# Patient Record
Sex: Male | Born: 1980 | Race: Asian | Hispanic: No | Marital: Married | Smoking: Former smoker
Health system: Southern US, Community
[De-identification: ages and names within clinical notes are randomized; demographics above are authoritative.]

## PROBLEM LIST (undated history)

## (undated) DIAGNOSIS — R011 Cardiac murmur, unspecified: Secondary | ICD-10-CM

## (undated) DIAGNOSIS — I10 Essential (primary) hypertension: Secondary | ICD-10-CM

## (undated) DIAGNOSIS — E785 Hyperlipidemia, unspecified: Secondary | ICD-10-CM

## (undated) HISTORY — DX: Essential (primary) hypertension: I10

## (undated) HISTORY — PX: CHOLECYSTECTOMY: SHX55

## (undated) HISTORY — DX: Hyperlipidemia, unspecified: E78.5

---

## 2018-01-17 ENCOUNTER — Encounter (HOSPITAL_COMMUNITY): Payer: Self-pay | Admitting: Family Medicine

## 2018-01-17 ENCOUNTER — Ambulatory Visit (HOSPITAL_COMMUNITY)
Admission: EM | Admit: 2018-01-17 | Discharge: 2018-01-17 | Disposition: A | Discharge status: Home / Self Care | Payer: Self-pay | Attending: Family Medicine | Admitting: Family Medicine

## 2018-01-17 DIAGNOSIS — G8929 Other chronic pain: Secondary | ICD-10-CM

## 2018-01-17 DIAGNOSIS — R1011 Right upper quadrant pain: Secondary | ICD-10-CM

## 2018-01-17 MED ORDER — TRAMADOL HCL 50 MG PO TABS: 50.0000 mg | ORAL_TABLET | Freq: Four times a day (QID) | ORAL | 0 refills | Status: DC | PRN

## 2018-01-17 MED ORDER — DICYCLOMINE HCL 20 MG PO TABS: 20.0000 mg | ORAL_TABLET | Freq: Two times a day (BID) | ORAL | 0 refills | Status: DC

## 2018-01-17 NOTE — Discharge Instructions (Addendum)
I believe you probably have gallstones.  To be sure, you will need an ultrasound.  I am suggesting that you try to put up with the pain tonight and go to the emergency room tomorrow morning when there is less traffic through the emergency room and the ultrasound will be available.  Do not eat breakfast tomorrow before going to the emergency department.  I have prescribed 2 different medicines which should help you endure the pain for tonight.

## 2018-01-17 NOTE — ED Triage Notes (Signed)
Pt presents with abdominal pain on right lower quadrant not associated with anything in particular.

## 2018-01-17 NOTE — ED Provider Notes (Signed)
[facility]    CSN: [id number] Arrival date & time: 01/17/18  1652     History   Chief Complaint Chief Complaint  Patient presents with  . Abdominal Pain    HPI Dustin [name] is a 37 y.o. male.   This is the first [facility] urgent care visit for this 37 year old man who complains of right sided abdominal pain and insomnia.  He has had chronic right sided abdominal pain for at least 6 months.  It is gotten worse the last few days.  He has had no nausea or vomiting, nor has he had fever or diarrhea.  He notes that his sister and father had gall bladder problems.  Patient's been up since 2 this morning with pain.  He notes that the pain does not change with eating.     History reviewed. No pertinent past medical history.  There are no active problems to display for this patient.   History reviewed. No pertinent surgical history.     Home Medications    Prior to Admission medications   Medication Sig Start Date End Date Taking? Authorizing Provider  dicyclomine (BENTYL) 20 MG tablet Take 1 tablet (20 mg total) by mouth 2 (two) times daily. 01/17/18   Elvina Sidle, MD  traMADol (ULTRAM) 50 MG tablet Take 1 tablet (50 mg total) by mouth every 6 (six) hours as needed. 01/17/18   Elvina Sidle, MD    Family History History reviewed. No pertinent family history.  Social History Social History   Tobacco Use  . Smoking status: Not on file  Substance Use Topics  . Alcohol use: Not on file  . Drug use: Not on file     Allergies   Patient has no known allergies.   Review of Systems Review of Systems  Gastrointestinal: Positive for abdominal pain.  All other systems reviewed and are negative.    Physical Exam Triage Vital Signs ED Triage Vitals  Enc Vitals Group     BP      Pulse      Resp      Temp      Temp src      SpO2      Weight      Height      Head Circumference      Peak Flow      Pain Score      Pain Loc      Pain  Edu?      Excl. in GC?    No data found.  Updated Vital Signs BP 125/76 (BP Location: Left Arm)   Pulse 85   Temp 98.2 F (36.8 C) (Oral)   Resp 18   SpO2 98%    Physical Exam  Constitutional: He appears well-developed and well-nourished.  HENT:  Head: Normocephalic and atraumatic.  Mouth/Throat: Oropharynx is clear and moist.  Eyes: Pupils are equal, round, and reactive to light.  Cardiovascular: Normal rate and normal heart sounds.  Pulmonary/Chest: Effort normal and breath sounds normal.  Abdominal: Soft. Normal appearance and bowel sounds are normal. There is tenderness in the right upper quadrant.  Neurological: He is alert.  Skin: Skin is warm.  Psychiatric: He has a normal mood and affect.  Nursing note and vitals reviewed.    UC Treatments / Results  Labs (all labs ordered are listed, but only abnormal results are displayed) Labs Reviewed - No data to display  EKG None  Radiology No results found.  Procedures Procedures (including  critical care time)  Medications Ordered in UC Medications - No data to display  Initial Impression / Assessment and Plan / UC Course  I have reviewed the triage vital signs and the nursing notes.  Pertinent labs & imaging results that were available during my care of the patient were reviewed by me and considered in my medical decision making (see chart for details).    Final Clinical Impressions(s) / UC Diagnoses   Final diagnoses:  Abdominal pain, chronic, right upper quadrant     Discharge Instructions     I believe you probably have gallstones.  To be sure, you will need an ultrasound.  I am suggesting that you try to put up with the pain tonight and go to the emergency room tomorrow morning when there is less traffic through the emergency room and the ultrasound will be available.  Do not eat breakfast tomorrow before going to the emergency department.  I have prescribed 2 different medicines which should help  you endure the pain for tonight.    ED Prescriptions    Medication Sig Dispense Auth. Provider   dicyclomine (BENTYL) 20 MG tablet Take 1 tablet (20 mg total) by mouth 2 (two) times daily. 20 tablet Elvina Sidle, MD   traMADol (ULTRAM) 50 MG tablet Take 1 tablet (50 mg total) by mouth every 6 (six) hours as needed. 15 tablet Elvina Sidle, MD     Controlled Substance Prescriptions [place] Controlled Substance Registry consulted? Not Applicable   Elvina Sidle, MD 01/17/18 1740

## 2018-01-25 ENCOUNTER — Emergency Department (HOSPITAL_COMMUNITY)
Admission: EM | Admit: 2018-01-25 | Discharge: 2018-01-25 | Disposition: A | Discharge status: Home / Self Care | Payer: 59 | Attending: Emergency Medicine | Admitting: Emergency Medicine

## 2018-01-25 ENCOUNTER — Emergency Department (HOSPITAL_COMMUNITY): Payer: 59

## 2018-01-25 ENCOUNTER — Encounter (HOSPITAL_COMMUNITY): Payer: Self-pay | Admitting: Emergency Medicine

## 2018-01-25 DIAGNOSIS — R1011 Right upper quadrant pain: Secondary | ICD-10-CM | POA: Diagnosis not present

## 2018-01-25 DIAGNOSIS — Z79899 Other long term (current) drug therapy: Secondary | ICD-10-CM | POA: Diagnosis not present

## 2018-01-25 LAB — CBC
HCT: 47.3 % (ref 39.0–52.0)
Hemoglobin: 15.2 g/dL (ref 13.0–17.0)
MCH: 26.5 pg (ref 26.0–34.0)
MCHC: 32.1 g/dL (ref 30.0–36.0)
MCV: 82.4 fL (ref 80.0–100.0)
Platelets: 238 10*3/uL (ref 150–400)
RBC: 5.74 MIL/uL (ref 4.22–5.81)
RDW: 12.9 % (ref 11.5–15.5)
WBC: 8.4 10*3/uL (ref 4.0–10.5)
nRBC: 0 % (ref 0.0–0.2)

## 2018-01-25 LAB — URINALYSIS, ROUTINE W REFLEX MICROSCOPIC
Bilirubin Urine: NEGATIVE
GLUCOSE, UA: NEGATIVE mg/dL
Ketones, ur: NEGATIVE mg/dL
Leukocytes, UA: NEGATIVE
NITRITE: NEGATIVE
PROTEIN: NEGATIVE mg/dL
SPECIFIC GRAVITY, URINE: 1.028 (ref 1.005–1.030)
pH: 5 (ref 5.0–8.0)

## 2018-01-25 LAB — COMPREHENSIVE METABOLIC PANEL
ALBUMIN: 4.3 g/dL (ref 3.5–5.0)
ALT: 19 U/L (ref 0–44)
AST: 20 U/L (ref 15–41)
Alkaline Phosphatase: 48 U/L (ref 38–126)
Anion gap: 7 (ref 5–15)
BUN: 15 mg/dL (ref 6–20)
CO2: 24 mmol/L (ref 22–32)
Calcium: 9.4 mg/dL (ref 8.9–10.3)
Chloride: 108 mmol/L (ref 98–111)
Creatinine, Ser: 0.92 mg/dL (ref 0.61–1.24)
GFR calc Af Amer: >60 mL/min (ref >60)
GLUCOSE: 94 mg/dL (ref 70–99)
Potassium: 3.8 mmol/L (ref 3.5–5.1)
Sodium: 139 mmol/L (ref 135–145)
TOTAL PROTEIN: 7.4 g/dL (ref 6.5–8.1)
Total Bilirubin: 0.8 mg/dL (ref 0.3–1.2)

## 2018-01-25 LAB — LIPASE, BLOOD: Lipase: 30 U/L (ref 11–51)

## 2018-01-25 MED ORDER — SODIUM CHLORIDE 0.9 % IV BOLUS
1000.0000 mL | Freq: Once | INTRAVENOUS | Status: AC
  Administered 2018-01-25: 1000 mL via INTRAVENOUS

## 2018-01-25 NOTE — ED Provider Notes (Signed)
Transfer of Care Note I assumed care of patient from Shawn Joy at 1600.  Agree with history, physical exam and plan.  See their note for further details.    Briefly, the patient is a 37 yo male with no PMHx who presents with abdominal pain for the past month.  Patient reports that the pain is mostly located in his right upper quadrant.  States it is been coming and going since the onset of his symptoms.  Current plan is as follows: Overall patient's evaluation has been unremarkable at the time of assumption of care.  At this time is to obtain right upper quadrant ultrasound and a UA to determine patient's disposition.  Reassessment: I personally reassessed the patient on the results of his negative right upper quadrant ultrasound and his UA.  UA does show small amount of blood and some bacteria.  After discussing this with the patient he also reported some intermittent right flank pain in addition to his right upper quadrant pain.  As a result of patient's UA findings and his report of right flank pain a CT stone study was obtained.  This was overall unremarkable.  As result I feel patient is appropriate for discharge at this time.  Given the bacteria seen on patient's UA a urine culture was sent.  We will plan to treat patient with antibiotics if his urine culture is positive. Patient is aware of this plan.  Asked to establish care with a primary care physician for follow-up in the next week.  Patient given very strict return cautions.  Patient in no acute distress at the time of discharge.   The care of this patient was supervised by Dr. Jacqulyn Bath, who agreed with the plan and management of the patient.   Clinical Impression: 1. RUQ pain     ED Dispo: Discharge    Ayman Brull, Winfield Rast, MD 01/25/18 Melchor Amour, MD 01/26/18 [phone]

## 2018-01-25 NOTE — ED Notes (Signed)
Pt escorted to bathroom to provide urine sample.

## 2018-01-25 NOTE — ED Provider Notes (Signed)
Dustin [name] [facility] EMERGENCY DEPARTMENT Provider Note   CSN: [id number] Arrival date & time: 01/25/18  1335     History   Chief Complaint Chief Complaint  Patient presents with  . Abdominal Pain    HPI Dustin [name] is a 37 y.o. male.  HPI  Dustin [name] is a 37 y.o. male, patient with no pertinent past medical history, presenting to the ED with abdominal pain for the past month.  Pain is in the right upper quadrant, sharp, fairly constant, 10/10, nonradiating.  He states it does not seem to change throughout the day or with eating.  Accompanied by occasional nausea and occasional diarrhea. Denies fever/chills, vomiting, constipation, dysuria, hematuria, hematochezia/melena, or any complaints.   History reviewed. No pertinent past medical history.  There are no active problems to display for this patient.   History reviewed. No pertinent surgical history.      Home Medications    Prior to Admission medications   Medication Sig Start Date End Date Taking? Authorizing Provider  dicyclomine (BENTYL) 20 MG tablet Take 1 tablet (20 mg total) by mouth 2 (two) times daily. 01/17/18   Elvina Sidle, MD  traMADol (ULTRAM) 50 MG tablet Take 1 tablet (50 mg total) by mouth every 6 (six) hours as needed. Patient taking differently: Take 50 mg by mouth every 6 (six) hours as needed for moderate pain.  01/17/18   Elvina Sidle, MD    Family History No family history on file.  Social History Social History   Tobacco Use  . Smoking status: Not on file  Substance Use Topics  . Alcohol use: Not on file  . Drug use: Not on file     Allergies   Patient has no known allergies.   Review of Systems Review of Systems  Constitutional: Negative for chills, diaphoresis and fever.  Respiratory: Negative for shortness of breath.   Cardiovascular: Negative for chest pain.  Gastrointestinal: Positive for abdominal pain, diarrhea and nausea. Negative for blood in  stool and vomiting.  Genitourinary: Negative for dysuria, hematuria, scrotal swelling and testicular pain.  All other systems reviewed and are negative.    Physical Exam Updated Vital Signs BP 107/82   Pulse (!) 111   Temp 98.2 F (36.8 C) (Oral)   Resp 15   SpO2 99%   Physical Exam  Constitutional: He appears well-developed and well-nourished. No distress.  HENT:  Head: Normocephalic and atraumatic.  Eyes: Conjunctivae are normal.  Neck: Neck supple.  Cardiovascular: Normal rate, regular rhythm, normal heart sounds and intact distal pulses.  Pulmonary/Chest: Effort normal and breath sounds normal. No respiratory distress.  Abdominal: Soft. There is tenderness (indicated verbally only) in the right upper quadrant. There is no guarding.  Musculoskeletal: He exhibits no edema.  Lymphadenopathy:    He has no cervical adenopathy.  Neurological: He is alert.  Skin: Skin is warm and dry. He is not diaphoretic.  Psychiatric: He has a normal mood and affect. His behavior is normal.  Nursing note and vitals reviewed.    ED Treatments / Results  Labs (all labs ordered are listed, but only abnormal results are displayed) Labs Reviewed  LIPASE, BLOOD  COMPREHENSIVE METABOLIC PANEL  CBC  URINALYSIS, ROUTINE W REFLEX MICROSCOPIC    EKG None  Radiology No results found.  Procedures Procedures (including critical care time)  Medications Ordered in ED Medications  sodium chloride 0.9 % bolus 1,000 mL (1,000 mLs Intravenous New Bag/Given 01/25/18 1536)     Initial Impression /  Assessment and Plan / ED Course  I have reviewed the triage vital signs and the nursing notes.  Pertinent labs & imaging results that were available during my care of the patient were reviewed by me and considered in my medical decision making (see chart for details).  Clinical Course as of Jan 26 1539  Wynelle Link Jan 25, 2018  1432 Patient declines analgesics at this time.   [SJ]    Clinical Course  User Index [SJ] Joy, Shawn C, PA-C    Patient presents with right upper quadrant abdominal pain for the past month.  He was seen by urgent care at the end of October and it was recommended that he go to the ED and have an ultrasound performed at that time, but he did not do so. CBC, CMP, and lipase reassuring.  End of shift patient care handoff report given to Dr. Otho Najjar, EM resident.  Plan: Right upper quadrant ultrasound pending.  Review ultrasound and UA results.  Reexamined patient.   Final Clinical Impressions(s) / ED Diagnoses   Final diagnoses:  RUQ pain    ED Discharge Orders    None       Concepcion Living 01/25/18 1543    Gwyneth Sprout, MD 01/25/18 1547

## 2018-01-25 NOTE — ED Triage Notes (Signed)
Patient reports RLQ pain, lower back pain and pain in his penis when he urinates, symptoms have been progressing over the past few months. Was seen at [facility] on 10/26 and was told to go to his doctor and have an ultrasound but his family member at bedside states he never went.

## 2018-01-27 LAB — URINE CULTURE

## 2018-02-17 ENCOUNTER — Other Ambulatory Visit: Payer: Self-pay | Admitting: Gastroenterology

## 2018-02-17 ENCOUNTER — Other Ambulatory Visit (HOSPITAL_COMMUNITY): Payer: Self-pay | Admitting: Gastroenterology

## 2018-02-17 DIAGNOSIS — R1011 Right upper quadrant pain: Secondary | ICD-10-CM

## 2018-02-27 ENCOUNTER — Encounter (HOSPITAL_COMMUNITY)
Admission: RE | Admit: 2018-02-27 | Discharge: 2018-02-27 | Disposition: A | Discharge status: Home / Self Care | Payer: 59 | Source: Ambulatory Visit | Attending: Gastroenterology | Admitting: Gastroenterology

## 2018-02-27 DIAGNOSIS — R1011 Right upper quadrant pain: Secondary | ICD-10-CM | POA: Diagnosis not present

## 2018-02-27 MED ORDER — TECHNETIUM TC 99M MEBROFENIN IV KIT
5.2000 | PACK | Freq: Once | INTRAVENOUS | Status: AC | PRN
  Administered 2018-02-27: 5.2 via INTRAVENOUS

## 2018-03-06 ENCOUNTER — Ambulatory Visit: Payer: Self-pay | Admitting: Surgery

## 2018-03-06 NOTE — H&P (Signed)
History of Present Illness Dustin [name](Dustin [name] K. Dustin Muecke MD; 03/06/2018 10:46 AM) The patient is a 37 year old male who presents for evaluation of gall stones. Referred by Dr. Levora AngelBrahmbhatt for biliary dyskinesia  This is a 37 year old [place] male who presents with several months of right upper quadrant abdominal pain. The pain is intermittent. It is fairly severe. The pain radiates around to his right flank. It is associated with bloating, increased flatulence, and diarrhea. He denies any nausea or vomiting. He has had a thorough evaluation including CT renal stone study, ultrasound, and HIDA scan. The only positive finding was a decreased gallbladder ejection fraction of 29%. When the patient has the pain, it is fairly severe. His diet consists of a lot of red meat, especially Lamb. He is here now to discuss cholecystectomy.   CLINICAL DATA: Right upper quadrant pain for 1 month.  EXAM: ULTRASOUND ABDOMEN LIMITED RIGHT UPPER QUADRANT  COMPARISON: None.  FINDINGS: Gallbladder:  The gallbladder is contracted limiting evaluation. However, no stones, sludge, wall thickening, pericholecystic fluid, or Murphy's sign appreciated.  Common bile duct:  Diameter: 2 mm  Liver:  The left hepatic lobe is not well seen due to shadowing bowel gas. Increased diffuse hepatic echogenicity. No focal mass. Portal vein is patent on color Doppler imaging with normal direction of blood flow towards the liver.  IMPRESSION: 1. The gallbladder is contracted limiting evaluation. However, no gallbladder abnormalities are noted. 2. The left hepatic lobe is not well seen due to shadowing bowel gas. Probable hepatic steatosis.   Electronically Signed By: Dustin [name] M.D On: 01/25/2018 16:40  Study Result  CLINICAL DATA: Right upper quadrant pain EXAM: NUCLEAR MEDICINE HEPATOBILIARY IMAGING WITH GALLBLADDER EF TECHNIQUE: Sequential images of the abdomen were obtained out to 60 minutes  following intravenous administration of radiopharmaceutical. After oral ingestion of Ensure, gallbladder ejection fraction was determined. At 60 min, normal ejection fraction is greater than 33%. RADIOPHARMACEUTICALS: 5.2 mCi Tc-6556m Choletec IV COMPARISON: None. FINDINGS: Prompt uptake and biliary excretion of activity by the liver is seen. Gallbladder activity is visualized, consistent with patency of cystic duct. Biliary activity passes into small bowel, consistent with patent common bile duct. Calculated gallbladder ejection fraction is 29%. (Normal gallbladder ejection fraction with Ensure is greater than 33%.) IMPRESSION: Normal uptake and excretion of biliary tracer. Decreased gallbladder ejection fraction at 29%. Recent CT demonstrated completely decompressed gallbladder. No duplication of the patient's symptoms were noted during fatty meal administration. Electronically Signed By: Alcide CleverMark [name] M.D. On: 02/27/2018 13:40     Past Surgical History (Armen Emelda FearFerguson, CMA; 03/06/2018 10:15 AM) No pertinent past surgical history   Diagnostic Studies History Renee Ramus(Armen Ferguson, CMA; 03/06/2018 10:15 AM) Colonoscopy  never  Allergies Renee Ramus(Armen Ferguson, CMA; 03/06/2018 10:16 AM) No Known Allergies [03/06/2018]:  Medication History (Armen Ferguson, CMA; 03/06/2018 10:16 AM) No Current Medications Medications Reconciled  Social History Renee Ramus(Armen Ferguson, CMA; 03/06/2018 10:15 AM) Alcohol use  Remotely quit alcohol use. No caffeine use  No drug use  Tobacco use  Former smoker.  Family History Renee Ramus(Armen Ferguson, CMA; 03/06/2018 10:15 AM) Diabetes Mellitus  Brother, Father, Sister. Heart Disease  Father. Heart disease in male family member before age 38  Hypertension  Mother.  Other Problems Renee Ramus(Armen Ferguson, CMA; 03/06/2018 10:15 AM) Back Pain  Hypercholesterolemia     Review of Systems (Armen Ferguson CMA; 03/06/2018 10:15 AM) General Present- Fatigue. Not  Present- Appetite Loss, Chills, Fever, Night Sweats, Weight Gain and Weight Loss. Skin Not Present- Change in Wart/Mole, Dryness, Hives, Jaundice,  New Lesions, Non-Healing Wounds, Rash and Ulcer. HEENT Not Present- Earache, Hearing Loss, Hoarseness, Nose Bleed, Oral Ulcers, Ringing in the Ears, Seasonal Allergies, Sinus Pain, Sore Throat, Visual Disturbances, Wears glasses/contact lenses and Yellow Eyes. Respiratory Not Present- Bloody sputum, Chronic Cough, Difficulty Breathing, Snoring and Wheezing. Breast Not Present- Breast Mass, Breast Pain, Nipple Discharge and Skin Changes. Cardiovascular Not Present- Chest Pain, Difficulty Breathing Lying Down, Leg Cramps, Palpitations, Rapid Heart Rate, Shortness of Breath and Swelling of Extremities. Gastrointestinal Present- Abdominal Pain and Excessive gas. Not Present- Bloating, Bloody Stool, Change in Bowel Habits, Chronic diarrhea, Constipation, Difficulty Swallowing, Gets full quickly at meals, Hemorrhoids, Indigestion, Nausea, Rectal Pain and Vomiting. Male Genitourinary Present- Blood in Urine. Not Present- Change in Urinary Stream, Frequency, Impotence, Nocturia, Painful Urination, Urgency and Urine Leakage. Musculoskeletal Not Present- Back Pain, Joint Pain, Joint Stiffness, Muscle Pain, Muscle Weakness and Swelling of Extremities. Neurological Not Present- Decreased Memory, Fainting, Headaches, Numbness, Seizures, Tingling, Tremor, Trouble walking and Weakness. Psychiatric Not Present- Anxiety, Bipolar, Change in Sleep Pattern, Depression, Fearful and Frequent crying. Endocrine Not Present- Cold Intolerance, Excessive Hunger, Hair Changes, Heat Intolerance, Hot flashes and New Diabetes.  Vitals (Armen Ferguson CMA; 03/06/2018 10:15 AM) 03/06/2018 10:15 AM Weight: 252 lb Height: 73in Body Surface Area: 2.37 m Body Mass Index: 33.25 kg/m  Temp.: 98.54F  Pulse: 82 (Regular)  P.OX: 97% (Room air) BP: 120/90 (Sitting, Left Arm,  Standard)       Physical Exam Molli Hazard K. Safir Michalec MD; 03/06/2018 10:46 AM) The physical exam findings are as follows: Note:WDWN in NAD Eyes: Pupils equal, round; sclera anicteric HENT: Oral mucosa moist; good dentition Neck: No masses palpated, no thyromegaly Lungs: CTA bilaterally; normal respiratory effort CV: Regular rate and rhythm; no murmurs; extremities well-perfused with no edema Abd: +bowel sounds, soft, mild RUQ tenderness, no palpable organomegaly; no palpable hernias Skin: Warm, dry; no sign of jaundice Psychiatric - alert and oriented x 4; calm mood and affect    Assessment & Plan Molli Hazard K. Ayano Douthitt MD; 03/06/2018 10:40 AM) BILIARY DYSKINESIA (K82.8) Current Plans Schedule for Surgery - Laparoscopic cholecystectomy with intraoperative cholangiogram. The surgical procedure has been discussed with the patient. Potential risks, benefits, alternative treatments, and expected outcomes have been explained. All of the patient's questions at this time have been answered. The likelihood of reaching the patient's treatment goal is good. The patient understand the proposed surgical procedure and wishes to proceed.  Dustin [name]. Corliss Skains, MD, [facility]  General/ Trauma Surgery Beeper [phone]  03/06/2018 10:48 AM

## 2020-01-13 ENCOUNTER — Other Ambulatory Visit: Payer: Self-pay

## 2020-01-13 ENCOUNTER — Emergency Department (HOSPITAL_BASED_OUTPATIENT_CLINIC_OR_DEPARTMENT_OTHER)
Admit: 2020-01-13 | Discharge: 2020-01-13 | Disposition: A | Discharge status: Home / Self Care | Payer: No Typology Code available for payment source | Attending: Emergency Medicine | Admitting: Emergency Medicine

## 2020-01-13 ENCOUNTER — Emergency Department (HOSPITAL_COMMUNITY): Payer: No Typology Code available for payment source

## 2020-01-13 ENCOUNTER — Emergency Department (HOSPITAL_COMMUNITY)
Admission: EM | Admit: 2020-01-13 | Discharge: 2020-01-13 | Disposition: A | Discharge status: Home / Self Care | Payer: No Typology Code available for payment source | Attending: Emergency Medicine | Admitting: Emergency Medicine

## 2020-01-13 DIAGNOSIS — M7989 Other specified soft tissue disorders: Secondary | ICD-10-CM | POA: Insufficient documentation

## 2020-01-13 DIAGNOSIS — R5383 Other fatigue: Secondary | ICD-10-CM | POA: Diagnosis not present

## 2020-01-13 DIAGNOSIS — Z20822 Contact with and (suspected) exposure to covid-19: Secondary | ICD-10-CM | POA: Insufficient documentation

## 2020-01-13 DIAGNOSIS — R52 Pain, unspecified: Secondary | ICD-10-CM

## 2020-01-13 DIAGNOSIS — R079 Chest pain, unspecified: Secondary | ICD-10-CM

## 2020-01-13 DIAGNOSIS — R0789 Other chest pain: Secondary | ICD-10-CM | POA: Diagnosis present

## 2020-01-13 LAB — CBC
HCT: 47.2 % (ref 39.0–52.0)
Hemoglobin: 14.9 g/dL (ref 13.0–17.0)
MCH: 25.9 pg — ABNORMAL LOW (ref 26.0–34.0)
MCHC: 31.6 g/dL (ref 30.0–36.0)
MCV: 81.9 fL (ref 80.0–100.0)
Platelets: 214 10*3/uL (ref 150–400)
RBC: 5.76 MIL/uL (ref 4.22–5.81)
RDW: 13.2 % (ref 11.5–15.5)
WBC: 6.2 10*3/uL (ref 4.0–10.5)
nRBC: 0 % (ref 0.0–0.2)

## 2020-01-13 LAB — BASIC METABOLIC PANEL
Anion gap: 13 (ref 5–15)
BUN: 15 mg/dL (ref 6–20)
CO2: 20 mmol/L — ABNORMAL LOW (ref 22–32)
Calcium: 9.6 mg/dL (ref 8.9–10.3)
Chloride: 105 mmol/L (ref 98–111)
Creatinine, Ser: 0.75 mg/dL (ref 0.61–1.24)
GFR, Estimated: >60 mL/min (ref >60)
Glucose, Bld: 104 mg/dL — ABNORMAL HIGH (ref 70–99)
Potassium: 4.3 mmol/L (ref 3.5–5.1)
Sodium: 138 mmol/L (ref 135–145)

## 2020-01-13 LAB — RESPIRATORY PANEL BY RT PCR (FLU A&B, COVID)
Influenza A by PCR: NEGATIVE
Influenza B by PCR: NEGATIVE
SARS Coronavirus 2 by RT PCR: NEGATIVE

## 2020-01-13 LAB — TROPONIN I (HIGH SENSITIVITY): Troponin I (High Sensitivity): 4 ng/L (ref <18)

## 2020-01-13 MED ORDER — SODIUM CHLORIDE 0.9 % IV BOLUS
1000.0000 mL | Freq: Once | INTRAVENOUS | Status: AC
  Administered 2020-01-13: 1000 mL via INTRAVENOUS

## 2020-01-13 MED ORDER — METOCLOPRAMIDE HCL 5 MG/ML IJ SOLN
10.0000 mg | Freq: Once | INTRAMUSCULAR | Status: AC
  Administered 2020-01-13: 10 mg via INTRAVENOUS
  Filled 2020-01-13: qty 2

## 2020-01-13 MED ORDER — ONDANSETRON HCL 4 MG/2ML IJ SOLN: 4.0000 mg | Freq: Once | INTRAMUSCULAR | Status: DC

## 2020-01-13 MED ORDER — METHOCARBAMOL 500 MG PO TABS: 500.0000 mg | ORAL_TABLET | Freq: Two times a day (BID) | ORAL | 0 refills | Status: DC

## 2020-01-13 MED ORDER — DIPHENHYDRAMINE HCL 25 MG PO CAPS
25.0000 mg | ORAL_CAPSULE | Freq: Once | ORAL | Status: AC
  Administered 2020-01-13: 25 mg via ORAL
  Filled 2020-01-13: qty 1

## 2020-01-13 MED ORDER — MORPHINE SULFATE (PF) 4 MG/ML IV SOLN
4.0000 mg | Freq: Once | INTRAVENOUS | Status: AC
  Administered 2020-01-13: 4 mg via INTRAVENOUS
  Filled 2020-01-13: qty 1

## 2020-01-13 NOTE — ED Provider Notes (Signed)
[facility] EMERGENCY DEPARTMENT Provider Note   CSN: [id number] Arrival date & time: 01/13/20  1021     History Chief Complaint  Patient presents with   Chest Pain    Dustin [name] is a 39 y.o. male.  HPI Patient is a 39 year old male with a past medical history significant for questionable hypercholesterolemia per his brother who is translating for him.  Patient is Arabic speaking.  He states that he is comfortable with translation by his brother who has more medical literacy than him.  Patient has numerous complaints today.  He states he is not a primary care doctor and is not sure who to follow-up with these issues for besides the ER.  What brought him to the ER today however was chest pain that has been intermittent over the past 3 days but became constant yesterday evening with new onset shortness of breath and some nausea without vomiting.  He denies any abdominal pain.  He states that the chest pain last night started radiating to his left arm and he started having severe pain in his left leg.  He states he has had some left calf pain for the past 3 weeks.  He denies any aggravating or mitigating factors resistance.  He denies any pleuritic chest pain or exertional chest pain.  The symptoms seem to come on while he was at rest.  Is been constant although waxing and waning since onset.  Denies any diaphoresis, vomiting, changes in bowel movements, lightheadedness or dizziness.  Denies any history of VTE.  He denies any alcohol, recreational drugs or smoking.  Does have family medical history of myocardial infarction including a father who at 80 year old had an MI.  Patient has also had issues sleeping for the past several months.  And has been more fatigued after eating.  Denies any other postprandial symptoms.  Denies any urinary complaints.  Also endorses some difficulty sleeping.  He states he has had upper left extremity paresthesias that feels like  pins-and-needles that began yesterday evening as well.     No past medical history on file.  There are no problems to display for this patient.   No past surgical history on file.     No family history on file.  Social History   Tobacco Use   Smoking status: Not on file  Substance Use Topics   Alcohol use: Not on file   Drug use: Not on file    Home Medications Prior to Admission medications   Medication Sig Start Date End Date Taking? Authorizing Provider  dicyclomine (BENTYL) 20 MG tablet Take 1 tablet (20 mg total) by mouth 2 (two) times daily. 01/17/18   Elvina Sidle, MD  methocarbamol (ROBAXIN) 500 MG tablet Take 1 tablet (500 mg total) by mouth 2 (two) times daily. 01/13/20   Gailen Shelter, PA  traMADol (ULTRAM) 50 MG tablet Take 1 tablet (50 mg total) by mouth every 6 (six) hours as needed. Patient taking differently: Take 50 mg by mouth every 6 (six) hours as needed for moderate pain.  01/17/18   Elvina Sidle, MD    Allergies    Patient has no known allergies.  Review of Systems   Review of Systems  Constitutional: Positive for fatigue. Negative for chills and fever.  HENT: Negative for congestion.   Eyes: Negative for pain.  Respiratory: Negative for cough and shortness of breath.   Cardiovascular: Positive for chest pain and leg swelling.  Gastrointestinal: Positive for nausea. Negative for  abdominal pain, constipation, diarrhea and vomiting.  Genitourinary: Negative for dysuria.  Musculoskeletal: Positive for myalgias.  Skin: Negative for rash.  Neurological: Positive for headaches. Negative for dizziness.    Physical Exam Updated Vital Signs BP 128/85    Pulse 66    Temp 97.9 F (36.6 C) (Oral)    Resp (!) 21    Ht 6\' 1"  (1.854 m)    Wt 111.1 kg    SpO2 98%    BMI 32.32 kg/m   Physical Exam Vitals and nursing note reviewed.  Constitutional:      General: He is not in acute distress.    Comments: Pleasant well-appearing 38 year old.   In no acute distress.  Sitting comfortably in bed.  Able answer questions (through translator) appropriately and follow commands. No increased work of breathing. Speaking in full sentences.  HENT:     Head: Normocephalic and atraumatic.     Nose: Nose normal.     Mouth/Throat:     Mouth: Mucous membranes are moist.  Eyes:     General: No scleral icterus. Cardiovascular:     Rate and Rhythm: Normal rate and regular rhythm.     Pulses: Normal pulses.     Heart sounds: Normal heart sounds.  Pulmonary:     Effort: Pulmonary effort is normal. No respiratory distress.     Breath sounds: No wheezing.  Abdominal:     Palpations: Abdomen is soft.     Tenderness: There is no abdominal tenderness. There is no right CVA tenderness, left CVA tenderness, guarding or rebound.  Musculoskeletal:     Cervical back: Normal range of motion.     Right lower leg: No edema.     Left lower leg: No edema.     Comments: No lower extremity edema.  Very mild left calf circumference discrepancy with some mild tenderness palpation of the left calf no bruising no step-off or deformity no bony tenderness.  Strength 5/5 bilateral upper and lower extremities.  Skin:    General: Skin is warm and dry.     Capillary Refill: Capillary refill takes less than 2 seconds.  Neurological:     Mental Status: He is alert. Mental status is at baseline.     Comments: Speech is fluent, clear without dysarthria or dysphasia.   Strength 5/5 in upper/lower extremities  Sensation intact in upper/lower extremities   CN I not tested  CN II grossly intact visual fields bilaterally. Did not visualize posterior eye.   CN III, IV, VI PERRLA and EOMs intact bilaterally  CN V Intact sensation to sharp and light touch to the face  CN VII facial movements symmetric  CN VIII not tested  CN IX, X no uvula deviation, symmetric rise of soft palate  CN XI 5/5 SCM and trapezius strength bilaterally  CN XII Midline tongue protrusion,  symmetric L/R movements   Psychiatric:        Mood and Affect: Mood normal.        Behavior: Behavior normal.     ED Results / Procedures / Treatments   Labs (all labs ordered are listed, but only abnormal results are displayed) Labs Reviewed  BASIC METABOLIC PANEL - Abnormal; Notable for the following components:      Result Value   CO2 20 (*)    Glucose, Bld 104 (*)    All other components within normal limits  CBC - Abnormal; Notable for the following components:   MCH 25.9 (*)    All other  components within normal limits  RESPIRATORY PANEL BY RT PCR (FLU A&B, COVID)  TROPONIN I (HIGH SENSITIVITY)    EKG EKG Interpretation  Date/Time:  Thursday January 13 2020 10:27:40 EDT Ventricular Rate:  74 PR Interval:  180 QRS Duration: 92 QT Interval:  368 QTC Calculation: 408 R Axis:   27 Text Interpretation: Normal sinus rhythm Normal ECG No old tracing to compare Confirmed by Jacalyn Lefevre [phone]) on 01/13/2020 12:58:36 PM   Radiology DG Chest 2 View  Result Date: 01/13/2020 CLINICAL DATA:  Chest pain, nausea EXAM: CHEST - 2 VIEW COMPARISON:  None. FINDINGS: The heart size and mediastinal contours are within normal limits. Both lungs are clear. The visualized skeletal structures are unremarkable. IMPRESSION: No active cardiopulmonary disease. Electronically Signed   By: Helyn Numbers MD   On: 01/13/2020 11:09   VAS [place] LOWER EXTREMITY VENOUS (DVT) (ONLY [facility] & [facility] 7a-7p)  Result Date: 01/13/2020  Lower Venous DVT Study Indications: Pain.  Risk Factors: None identified. Comparison Study: No prior studies. Performing Technologist: Chanda Busing RVT  Examination Guidelines: A complete evaluation includes B-mode imaging, spectral Doppler, color Doppler, and power Doppler as needed of all accessible portions of each vessel. Bilateral testing is considered an integral part of a complete examination. Limited examinations for reoccurring indications may be performed as noted. The  reflux portion of the exam is performed with the patient in reverse Trendelenburg.  +-----+---------------+---------+-----------+----------+--------------+  RIGHT Compressibility Phasicity Spontaneity Properties Thrombus Aging  +-----+---------------+---------+-----------+----------+--------------+  CFV   Full            Yes       Yes                                    +-----+---------------+---------+-----------+----------+--------------+   +---------+---------------+---------+-----------+----------+--------------+  LEFT      Compressibility Phasicity Spontaneity Properties Thrombus Aging  +---------+---------------+---------+-----------+----------+--------------+  CFV       Full            Yes       Yes                                    +---------+---------------+---------+-----------+----------+--------------+  SFJ       Full                                                             +---------+---------------+---------+-----------+----------+--------------+  FV Prox   Full                                                             +---------+---------------+---------+-----------+----------+--------------+  FV Mid    Full                                                             +---------+---------------+---------+-----------+----------+--------------+  FV Distal Full                                                             +---------+---------------+---------+-----------+----------+--------------+  PFV       Full                                                             +---------+---------------+---------+-----------+----------+--------------+  POP       Full            Yes       Yes                                    +---------+---------------+---------+-----------+----------+--------------+  PTV       Full                                                             +---------+---------------+---------+-----------+----------+--------------+  PERO      Full                                                              +---------+---------------+---------+-----------+----------+--------------+     Summary: RIGHT: - No evidence of common femoral vein obstruction.  LEFT: - There is no evidence of deep vein thrombosis in the lower extremity.  - No cystic structure found in the popliteal fossa.  *See table(s) above for measurements and observations.    Preliminary     Procedures Procedures (including critical care time)  Medications Ordered in ED Medications  morphine 4 MG/ML injection 4 mg (4 mg Intravenous Given 01/13/20 1232)  sodium chloride 0.9 % bolus 1,000 mL (1,000 mLs Intravenous New Bag/Given 01/13/20 1231)  metoCLOPramide (REGLAN) injection 10 mg (10 mg Intravenous Given 01/13/20 1232)  diphenhydrAMINE (BENADRYL) capsule 25 mg (25 mg Oral Given 01/13/20 1232)    ED Course  I have reviewed the triage vital signs and the nursing notes.  Pertinent labs & imaging results that were available during my care of the patient were reviewed by me and considered in my medical decision making (see chart for details).  Patient is 40 year old Arabic speaking gentleman translator used via his brother who is medical functional.  Patient has no known past medical history does not frequently see primary care doctor.  Presented today with multiple symptoms including paresthesias of left arm with no objective numbness or weakness of any extremities.  Left calf pain, chest pain and fatigue.  The symptoms of all been ongoing for some time although his chest pain became worse and constant at 3 PM yesterday has not abated since.  Low suspicion for ACS or PE.  Will obtain troponin  x1.  Chest x-ray, lower extremity ultrasound and basic labs with Covid test.  Respiratory panel negative for Covid and flu.  CBC without leukocytosis or anemia.  BMP with no significant electrolyte abnormalities.  Troponin x1 within normal limits.  EKG is normal sinus rhythm without axis deviation ST-T wave abnormalities or evidence  of ischemia.  Vascular ultrasound is negative for DVT.  Clinical Course as of Jan 12 1500  Thu Jan 13, 2020  1339 LLE negative for DVT   [WF]    Clinical Course User Index [WF] Gailen ShelterFondaw, Mansfield Dann S, [place]   MDM Rules/Calculators/A&P                          Patient and brother made aware of results.  They are understanding of plan to discharge at this time.  They are agreeable to plan.  Will provide patient with Robaxin and Tylenol and ibuprofen recommendations.  Will follow up with her primary care doctor.  He was given strict return precautions the ER.  On my reassessment patient was asleep in the bed.  He denies any symptoms currently.  The medical records were personally reviewed by myself. I personally reviewed all lab results and interpreted all imaging studies and either concurred with their official read or contacted radiology for clarification. Additional history obtained from old records family members.  This patient appears reasonably screened and I doubt any other medical condition requiring further workup, evaluation, or treatment in the ED at this time prior to discharge.   Patient's vitals are WNL apart from vital sign abnormalities discussed above, patient is in NAD, and able to ambulate in the ED at their baseline and able to tolerate PO.  Pain has been managed or a plan has been made for home management and has no complaints prior to discharge. Patient is comfortable with above plan and for discharge at this time. All questions were answered prior to disposition. Results from the ER workup discussed with the patient face to face and all questions answered to the best of my ability. The patient is safe for discharge with strict return precautions. Patient appears safe for discharge with appropriate follow-up. Conveyed my impression with the patient and they voiced understanding and are agreeable to plan.   An After Visit Summary was printed and given to the patient.  Portions of  this note were generated with Scientist, clinical (histocompatibility and immunogenetics)Dragon dictation software. Dictation errors may occur despite best attempts at proofreading.   I discussed this case with my attending physician who cosigned this note including patient's presenting symptoms, physical exam, and planned diagnostics and interventions. Attending physician stated agreement with plan or made changes to plan which were implemented.    Final Clinical Impression(s) / ED Diagnoses Final diagnoses:  Chest pain, unspecified type  Left leg swelling  Fatigue, unspecified type    Rx / DC Orders ED Discharge Orders         Ordered    methocarbamol (ROBAXIN) 500 MG tablet  2 times daily        01/13/20 1457           Solon AugustaFondaw, Amontae Ng NavarreS, [place] 01/13/20 1504    Jacalyn LefevreHaviland, Julie, MD 01/13/20 1606

## 2020-01-13 NOTE — ED Triage Notes (Addendum)
Patient arrives to ED with complaints of centralized chest pain x3 days. Starting last night patient developed shortness of breath, diaphoresis, and nausea that continued to this morning. Starting this morning the pain now radiates to left arm and left leg. States that left calf has been sore for months.

## 2020-01-13 NOTE — Discharge Instructions (Addendum)
Your work-up today was reassuring.  Your leg ultrasound was negative for any blood clots.  Your EKG was reassuring as well as your blood work.  At this point is very important for you to follow-up with primary care doctor and establish care.  Patient plenty of fluids primarily water and you may take Tylenol and ibuprofen as described below.  I also prescribed you a muscle relaxer called Robaxin which you may take for pain as well.  Please use Tylenol or ibuprofen for pain.  You may use 600 mg ibuprofen every 6 hours or 1000 mg of Tylenol every 6 hours.  You may choose to alternate between the 2.  This would be most effective.  Not to exceed 4 g of Tylenol within 24 hours.  Not to exceed 3200 mg ibuprofen 24 hours.

## 2020-01-13 NOTE — Progress Notes (Signed)
Left lower extremity venous duplex has been completed. Preliminary results can be found in CV Proc through chart review.  Results were given to [facility] PA.  01/13/20 1:37 PM Olen Cordial RVT

## 2020-01-31 ENCOUNTER — Ambulatory Visit: Payer: Self-pay | Admitting: Cardiology

## 2020-02-01 NOTE — Progress Notes (Signed)
Patient referred by Elwyn Reach, MD for chest pain  Subjective:   Dustin [name], male    DOB: 06-13-1980, 39 y.o.   MRN: [id number]   Chief Complaint  Patient presents with  . Chest Pain  . New Patient (Initial Visit)     HPI  39 y.o. [name] male with family h/o early CAD, hyperlipidemia, with exertional dyspnea, leg pain  Patient is here today with his cousin brother, who interpretes for him.   Patient works at an [employer]rest[employer].  He walks and lifts heavy objects related to his work duties.  He has noticed dyspnea with more than usual physical exertion, including after sexual intercourse.  He denies any chest pain.  He has strong family history of heart disease with his father having had a fatal MI at age 50.  On a separate note, patient also reports pain in both his legs, more so when he sleeps at night.  Patient was recently [facility] in [facility] emergency department with complaints of chest pain.  ACS was ruled out.  He has had episodes of high blood pressure up to 180/100 mmHg. however, blood pressure has been stable over the last couple of weeks.     Past Medical History:  Diagnosis Date  . Hyperlipidemia      Past Surgical History:  Procedure Laterality Date  . CHOLECYSTECTOMY       Social History   Tobacco Use  Smoking Status Former Smoker  . Packs/day: 0.25  . Years: 1.00  . Pack years: 0.25  . Types: Cigarettes  . Quit date: 2015  . Years since quitting: 6.8  Smokeless Tobacco Never Used    Social History   Substance and Sexual Activity  Alcohol Use Not Currently     Family History  Problem Relation Age of Onset  . Hypertension Mother   . Heart attack Father   . Diabetes Father   . Diabetes Sister   . Hypertension Sister   . Diabetes Brother   . Hypertension Brother      No current outpatient medications on file prior to visit.   No current facility-administered medications on file prior to visit.    Cardiovascular and  other pertinent studies:  EKG 02/02/2020: Sinus rhythm 74 bpm   Recent labs: 01/25/2020: Glucose 93, BUN/Cr 16/0.80. EGFR 112. Na/K 138/4.7. Rest of the CMP normal H/H 14.8/45.7. MCV 80.7. Platelets 224 HbA1C 5.3% Chol 212, TG 125, HDL 50, LDL 137 TSH 2.1 normal    Review of Systems  Cardiovascular: Positive for dyspnea on exertion. Negative for chest pain, leg swelling, palpitations and syncope.         Vitals:   02/02/20 1248  BP: 117/75  Pulse: 79  Resp: 16  SpO2: 97%     Body mass index is 34.43 kg/m. Filed Weights   02/02/20 1248  Weight: 261 lb (118.4 kg)     Objective:   Physical Exam Vitals and nursing note reviewed.  Constitutional:      General: He is not in acute distress. Neck:     Vascular: No JVD.  Cardiovascular:     Rate and Rhythm: Normal rate and regular rhythm.     Heart sounds: Normal heart sounds. No murmur heard.   Pulmonary:     Effort: Pulmonary effort is normal.     Breath sounds: Normal breath sounds. No wheezing or rales.          Assessment & Recommendations:   39 y.o.  [name] male with family h/o early CAD, exertional dyspnea, leg pain  Anginal equivalent ([place]) Strong family history of CAD with symptoms concerning for angina equivalent.  Recommend coronary CT angiogram with calcium score.  Recommend aspirin 81 mg, Crestor 20 mg daily.  Mixed hyperlipidemia Started Crestor 20 mg daily.  Thank you for referring the patient to [place]. Please feel free to contact with any questions.   Nigel Mormon, MD Pager: [phone] Office: [phone]

## 2020-02-02 ENCOUNTER — Other Ambulatory Visit: Payer: Self-pay

## 2020-02-02 ENCOUNTER — Encounter: Payer: Self-pay | Admitting: Cardiology

## 2020-02-02 ENCOUNTER — Ambulatory Visit: Payer: Self-pay | Admitting: Cardiology

## 2020-02-02 VITALS — BP 117/75 | HR 79 | Resp 16 | Ht 73.0 in | Wt 261.0 lb

## 2020-02-02 DIAGNOSIS — I10 Essential (primary) hypertension: Secondary | ICD-10-CM

## 2020-02-02 DIAGNOSIS — R079 Chest pain, unspecified: Secondary | ICD-10-CM | POA: Insufficient documentation

## 2020-02-02 DIAGNOSIS — Z8249 Family history of ischemic heart disease and other diseases of the circulatory system: Secondary | ICD-10-CM

## 2020-02-02 DIAGNOSIS — E782 Mixed hyperlipidemia: Secondary | ICD-10-CM

## 2020-02-02 DIAGNOSIS — R0609 Other forms of dyspnea: Secondary | ICD-10-CM

## 2020-02-02 DIAGNOSIS — I208 Other forms of angina pectoris: Secondary | ICD-10-CM

## 2020-02-02 DIAGNOSIS — R0600 Dyspnea, unspecified: Secondary | ICD-10-CM

## 2020-02-02 DIAGNOSIS — I2089 Other forms of angina pectoris: Secondary | ICD-10-CM

## 2020-02-02 MED ORDER — METOPROLOL TARTRATE 50 MG PO TABS: 50.0000 mg | ORAL_TABLET | ORAL | 0 refills | Status: DC

## 2020-02-02 MED ORDER — ASPIRIN EC 81 MG PO TBEC: 81.0000 mg | DELAYED_RELEASE_TABLET | Freq: Every day | ORAL | 3 refills | Status: DC

## 2020-02-02 MED ORDER — ROSUVASTATIN CALCIUM 20 MG PO TABS: 20.0000 mg | ORAL_TABLET | Freq: Every day | ORAL | 3 refills | Status: DC

## 2020-02-02 NOTE — Patient Instructions (Signed)
Your cardiac CT will be scheduled at the locations below:   [facility]  [address]  [place], [place] [address]  [phone]    If scheduled at [facility], please arrive at the [facility] main entrance of [facility] 30-45 minutes prior to test start time.  Proceed to the [facility] Radiology Department (first floor) to check-in and test prep.   Please follow these instructions carefully (unless otherwise directed):    Hold all erectile dysfunction medications at least 3 days (72 hrs) prior to test.   On the Night Before the Test:   Be sure to Drink plenty of water.   Do not consume any caffeinated/decaffeinated beverages or chocolate 12 hours prior to your test.   Do not take any antihistamines 12 hours prior to your test.   If the patient has contrast allergy:  Patient will need a prescription for Prednisone and very clear instructions (as follows):  1. Prednisone 50 mg - take 13 hours prior to test  2. Take another Prednisone 50 mg 7 hours prior to test  3. Take another Prednisone 50 mg 1 hour prior to test  4. Take Benadryl 50 mg 1 hour prior to test   Patient must complete all four doses of above prophylactic medications.   Patient will need a ride after test due to Benadryl.   On the Day of the Test:   Drink plenty of water. Do not drink any water within one hour of the test.   Do not eat any food 4 hours prior to the test.   You may take your regular medications prior to the test.    - Metoprolol tartarate 50 mg 2 hours before CT scan You may stop it after the CT scan, unless specified otherwise by me.         After the Test:   Drink plenty of water.   After receiving IV contrast, you may experience a mild flushed feeling. This is normal.   On occasion, you may experience a mild rash up to 24 hours after the test. This is not dangerous. If this occurs, you can take Benadryl 25 mg and increase your fluid intake.   If  you experience trouble breathing, this can be serious. If it is severe call 911 IMMEDIATELY. If it is mild, please call our office.   If you take any of these medications: Glipizide/Metformin, Avandament, Glucavance, please do not take 48 hours after completing test unless otherwise instructed.     Please contact the cardiac imaging nurse navigator should you have any questions/concerns  Rockwell Alexandria, RN Navigator Cardiac Imaging  [facility]  [phone] Office  [phone] Cell

## 2020-02-03 NOTE — Addendum Note (Signed)
Addended by: Elder Negus on: 02/03/2020 03:44 PM   Modules accepted: Orders

## 2020-02-04 ENCOUNTER — Other Ambulatory Visit: Payer: Self-pay

## 2020-02-04 ENCOUNTER — Ambulatory Visit: Payer: No Typology Code available for payment source

## 2020-02-04 DIAGNOSIS — R0600 Dyspnea, unspecified: Secondary | ICD-10-CM

## 2020-02-04 DIAGNOSIS — R0609 Other forms of dyspnea: Secondary | ICD-10-CM

## 2020-02-07 ENCOUNTER — Other Ambulatory Visit: Payer: Self-pay | Admitting: Cardiology

## 2020-02-07 DIAGNOSIS — I208 Other forms of angina pectoris: Secondary | ICD-10-CM

## 2020-02-07 NOTE — Progress Notes (Signed)
Please schedule f/u in early Dec

## 2020-02-07 NOTE — Addendum Note (Signed)
Addended by: Elder Negus on: 02/07/2020 11:14 AM   Modules accepted: Orders

## 2020-02-21 ENCOUNTER — Telehealth (HOSPITAL_COMMUNITY): Payer: Self-pay | Admitting: Emergency Medicine

## 2020-02-21 NOTE — Telephone Encounter (Signed)
Reaching out to patient to offer assistance regarding upcoming cardiac imaging study; pt verbalizes understanding of appt date/time, parking situation and where to check in, pre-test NPO status and medications ordered, and verified current allergies; name and call back number provided for further questions should they arise Dustin Alexandria RN Navigator Cardiac Imaging [facility] [phone] office [phone] cell  Pt given 2 doses of 50mg  metoprolol tartrate. 1 taken today and the other will be taken 2 hr prior to scan. Dustin [name]

## 2020-02-22 ENCOUNTER — Other Ambulatory Visit: Payer: Self-pay

## 2020-02-22 ENCOUNTER — Ambulatory Visit (HOSPITAL_COMMUNITY)
Admission: RE | Admit: 2020-02-22 | Discharge: 2020-02-22 | Disposition: A | Discharge status: Home / Self Care | Payer: No Typology Code available for payment source | Source: Ambulatory Visit | Attending: Internal Medicine | Admitting: Internal Medicine

## 2020-02-22 DIAGNOSIS — I208 Other forms of angina pectoris: Secondary | ICD-10-CM

## 2020-02-22 MED ORDER — NITROGLYCERIN 0.4 MG SL SUBL
SUBLINGUAL_TABLET | SUBLINGUAL | Status: AC
  Administered 2020-02-22: 0.8 mg via SUBLINGUAL
  Filled 2020-02-22: qty 2

## 2020-02-22 MED ORDER — NITROGLYCERIN 0.4 MG SL SUBL: 0.8000 mg | SUBLINGUAL_TABLET | Freq: Once | SUBLINGUAL | Status: AC

## 2020-02-22 MED ORDER — METOPROLOL TARTRATE 5 MG/5ML IV SOLN: 5.0000 mg | INTRAVENOUS | Status: DC | PRN

## 2020-02-22 MED ORDER — IOHEXOL 350 MG/ML SOLN
80.0000 mL | Freq: Once | INTRAVENOUS | Status: AC | PRN
  Administered 2020-02-22: 80 mL via INTRAVENOUS

## 2020-02-29 DIAGNOSIS — E782 Mixed hyperlipidemia: Secondary | ICD-10-CM | POA: Insufficient documentation

## 2020-02-29 DIAGNOSIS — Q231 Congenital insufficiency of aortic valve: Secondary | ICD-10-CM | POA: Insufficient documentation

## 2020-02-29 DIAGNOSIS — Q262 Total anomalous pulmonary venous connection: Secondary | ICD-10-CM | POA: Insufficient documentation

## 2020-02-29 DIAGNOSIS — Q2381 Bicuspid aortic valve: Secondary | ICD-10-CM | POA: Insufficient documentation

## 2020-02-29 NOTE — Progress Notes (Signed)
Patient referred by Elwyn Reach, MD for chest pain  Subjective:   Dustin [name], male    DOB: 08/02/1980, 39 y.o.   MRN: [id number]   Chief Complaint  Patient presents with   Follow-up   Results     HPI  39 y.o. [name] male with family h/o early CAD, hyperlipidemia, with exertional dyspnea, leg pain  Recent cardiac CT reviewed with the patient, details below.  Patient continues to have episodes of exertional shortness of breath, along with episodes of dizziness that seem to occur even at rest.  Again, he does not have any chest pain.  Initial consultation HPI 01/2020: Patient is here today with his cousin brother, who interpretes for him.   Patient works at an [employer].  He walks and lifts heavy objects related to his work duties.  He has noticed dyspnea with more than usual physical exertion, including after sexual intercourse.  He denies any chest pain.  He has strong family history of heart disease with his father having had a fatal MI at age 61.  On a separate note, patient also reports pain in both his legs, more so when he sleeps at night.  Patient was recently seen in [facility] emergency department with complaints of chest pain.  ACS was ruled out.  He has had episodes of high blood pressure up to 180/100 mmHg. however, blood pressure has been stable over the last couple of weeks.    Current Outpatient Medications on File Prior to Visit  Medication Sig Dispense Refill   aspirin EC 81 MG tablet Take 1 tablet (81 mg total) by mouth daily. Swallow whole. 30 tablet 3   metoprolol tartrate (LOPRESSOR) 50 MG tablet Take 1 tablet (50 mg total) by mouth as directed. 50 mg 2 hours before CT scan 2 tablet 0   rosuvastatin (CRESTOR) 20 MG tablet Take 1 tablet (20 mg total) by mouth daily. (Patient not taking: Reported on 02/22/2020) 30 tablet 3   No current facility-administered medications on file prior to visit.    Cardiovascular and other pertinent  studies:  Cardiac CTA 02/22/2020: 1. Coronary calcium score of 0. This was 1st percentile for age, sex, and race matched control. 2. Normal coronary origin.  Right dominance. 3. CAD-RADS 0. No evidence of CAD (0%). Consider non-atherosclerotic causes of chest pain. 4. Bicuspid aortic valve with moderate to severe leaflet calcification that extends into the left ventricular outflow tract. Aortic valve calcium score 1448. 5. Mild ascending aortic dilation, maximal diameter 41 mm. 6. 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  Echocardiogram 02/04/2020:  Left ventricle cavity is normal in size. Moderate concentric hypertrophy  of the left ventricle. Normal global wall motion. Normal LV systolic  function with EF 55%. Doppler evidence of grade I (impaired) diastolic  dysfunction, normal LAP.  Left atrial cavity is mildly dilated.  Likely bicuspid aortic valve with moderate calcification. Mild aortic  stenosis. Vmax 2.2 m/sec, mean PG 11 mmHg, AVA 2.2 cm2 by continuity  equation. Moderate (Grade II) aortic regurgitation.  Mild tricuspid regurgitation.  The aortic root is mildly dilated at 3.8 cm.  No evidence of pulmonary hypertension.  EKG 02/02/2020: Sinus rhythm 74 bpm  Recent labs: 01/25/2020: Glucose 93, BUN/Cr 16/0.80. EGFR 112. Na/K 138/4.7. Rest of the CMP normal H/H 14.8/45.7. MCV 80.7. Platelets 224 HbA1C 5.3% Chol 212, TG 125, HDL 50, LDL 137 TSH 2.1 normal    Review of Systems  Cardiovascular: Positive for dyspnea  on exertion. Negative for chest pain, leg swelling, palpitations and syncope.         Vitals:   03/01/20 1102  BP: 139/80  Pulse: 68  Resp: 16  SpO2: 97%     Body mass index is 34.57 kg/m. Filed Weights   03/01/20 1102  Weight: 262 lb (118.8 kg)     Objective:   Physical Exam Vitals and nursing note reviewed.  Constitutional:      General: He is not in acute distress. Neck:     Vascular: No JVD.   Cardiovascular:     Rate and Rhythm: Normal rate and regular rhythm.     Heart sounds: Normal heart sounds. No murmur heard.   Pulmonary:     Effort: Pulmonary effort is normal.     Breath sounds: Normal breath sounds. No wheezing or rales.          Assessment & Recommendations:   39 y.o. [name] male with family h/o early CAD, exertional dyspnea, bicuspid aortic stenosis we will continue to follow hemoglobin daily, normal anomalo revealed [place] pulmonary venous drainage.  His symptoms of exertional dyspnea out of proportion to findings of moderate bicuspid aortic stenosis and moderate aortic regurgitation.  He has no coronary artery disease on CTA.  Consider noncardiac cause for symptoms of exertional dyspnea and dizziness.  Defer to PCP regarding referral to pulmonology and possibly ENT for further evaluation.  Will repeat echocardiogram in 6 months to re-evaluate bicuspid aortic stenosis and regurgitation and aortopathy.  Avoid strenuous physical activity given the finding of mild bicuspid aortopathy.  Continue Crestor 20 mg for hyperlipidemia.  Okay to stop aspirin.  Erythematous palpitations,    Nigel Mormon, MD Pager: [phone] Office: [phone]

## 2020-03-01 ENCOUNTER — Encounter: Payer: Self-pay | Admitting: Cardiology

## 2020-03-01 ENCOUNTER — Ambulatory Visit: Payer: No Typology Code available for payment source

## 2020-03-01 ENCOUNTER — Other Ambulatory Visit: Payer: Self-pay

## 2020-03-01 ENCOUNTER — Ambulatory Visit: Payer: No Typology Code available for payment source | Admitting: Cardiology

## 2020-03-01 VITALS — BP 139/80 | HR 68 | Resp 16 | Ht 73.0 in | Wt 262.0 lb

## 2020-03-01 DIAGNOSIS — I10 Essential (primary) hypertension: Secondary | ICD-10-CM

## 2020-03-01 DIAGNOSIS — E782 Mixed hyperlipidemia: Secondary | ICD-10-CM

## 2020-03-01 DIAGNOSIS — Q231 Congenital insufficiency of aortic valve: Secondary | ICD-10-CM

## 2020-03-01 DIAGNOSIS — R002 Palpitations: Secondary | ICD-10-CM

## 2020-03-01 DIAGNOSIS — Q262 Total anomalous pulmonary venous connection: Secondary | ICD-10-CM

## 2020-03-06 IMAGING — US US ABDOMEN LIMITED
1 series · 14 of 22 positions shown · non-contrast
Comparison: None.

CLINICAL DATA: Right upper quadrant pain for 1 month.

EXAM:
ULTRASOUND ABDOMEN LIMITED RIGHT UPPER QUADRANT

[Series 1: us abdomen limited · 0.19mm/px · 14 of 22 slices shown]
[im 1/22]
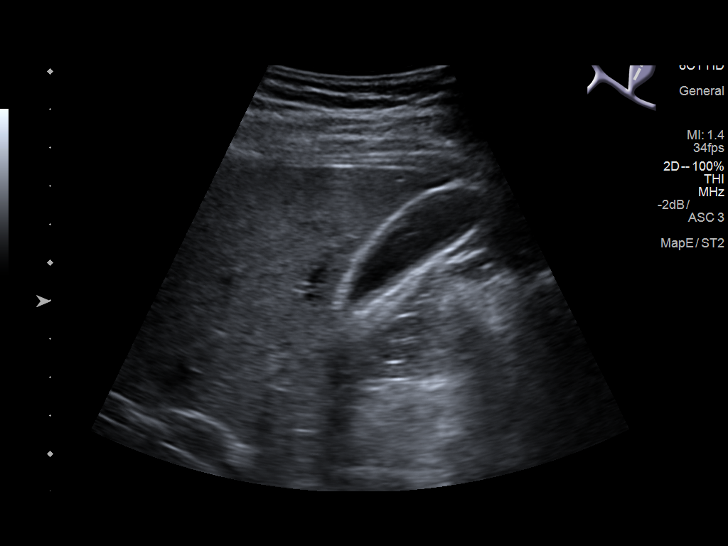
[im 3/22]
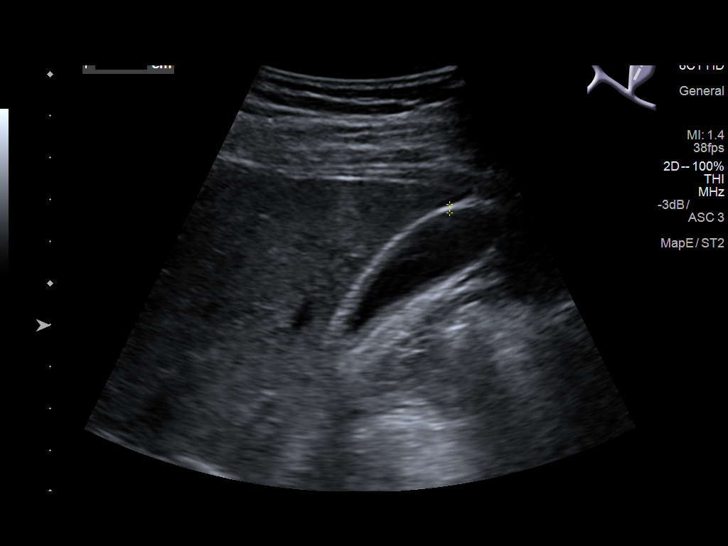
[im 4/22]
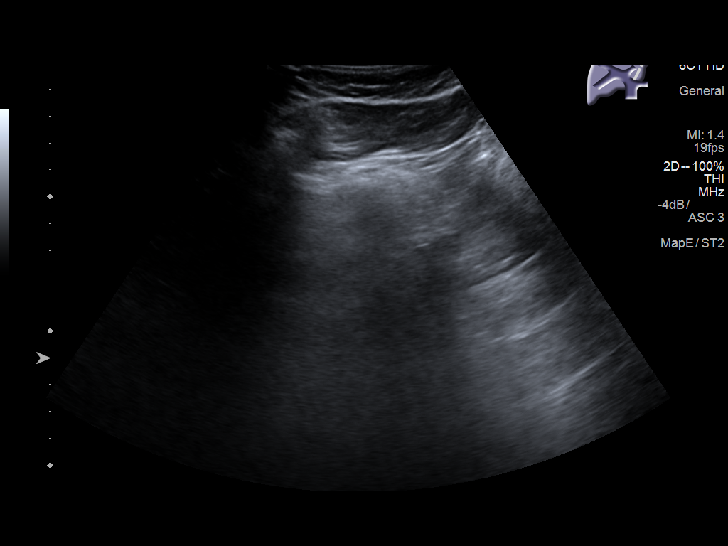
[im 6/22]
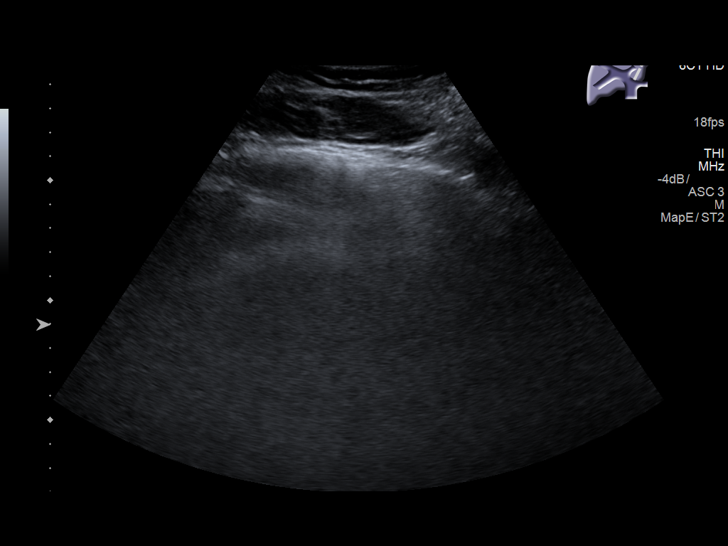
[im 8/22]
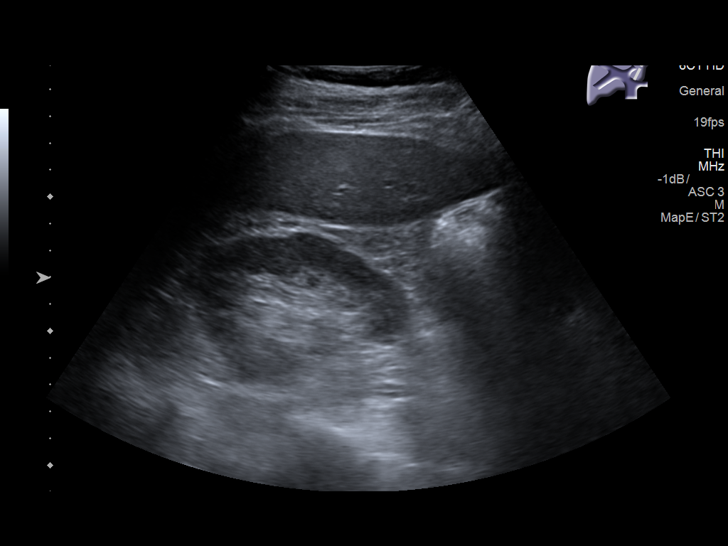
[im 9/22]
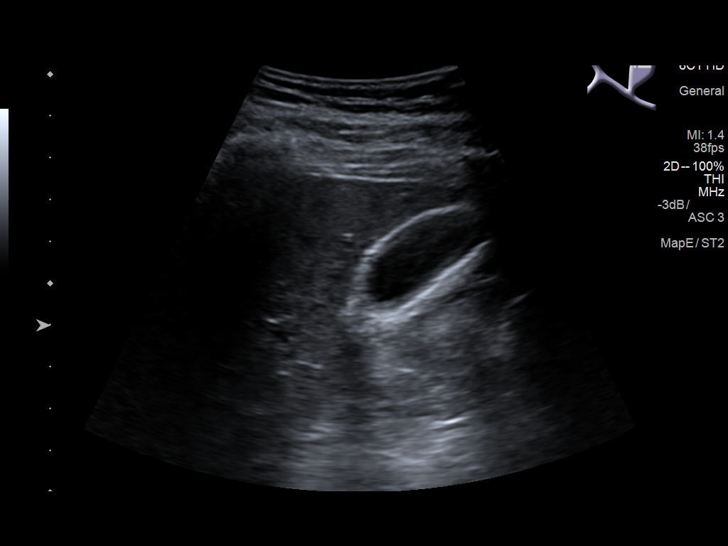
[im 11/22]
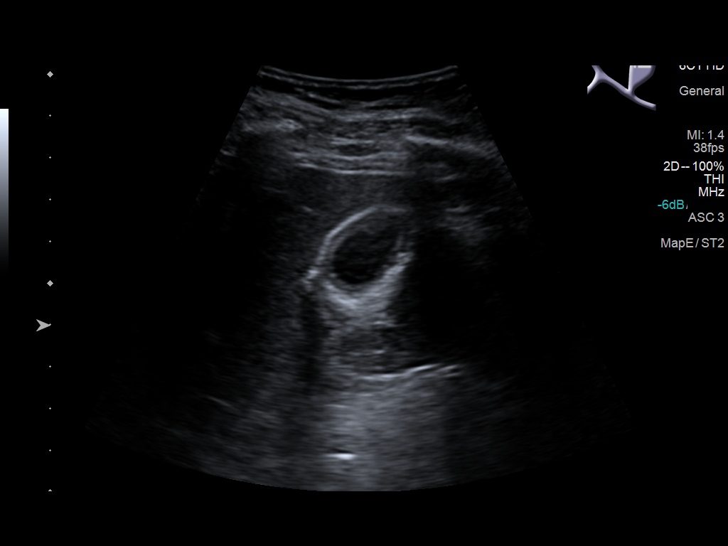
[im 12/22]
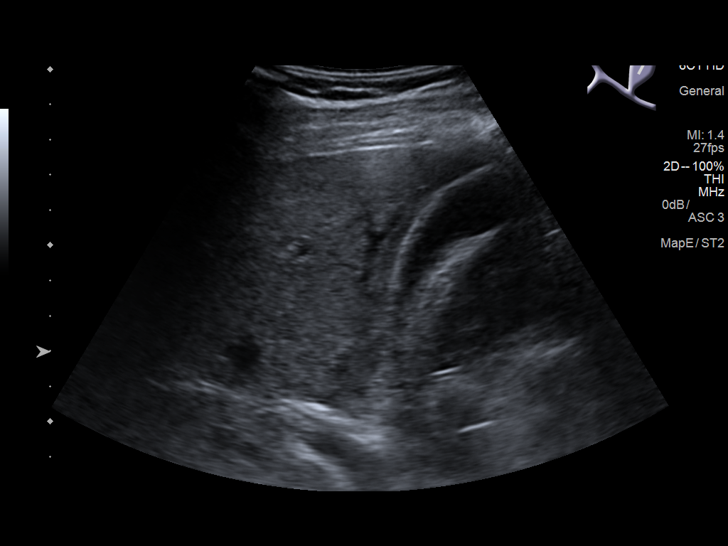
[im 14/22]
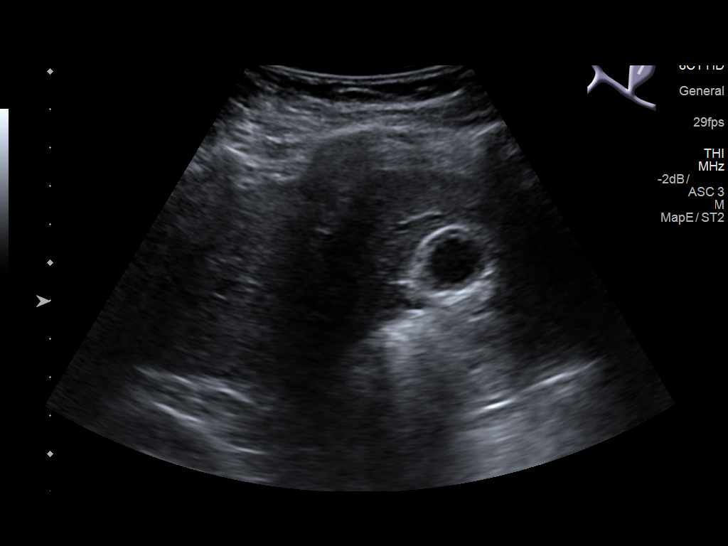
[im 15/22]
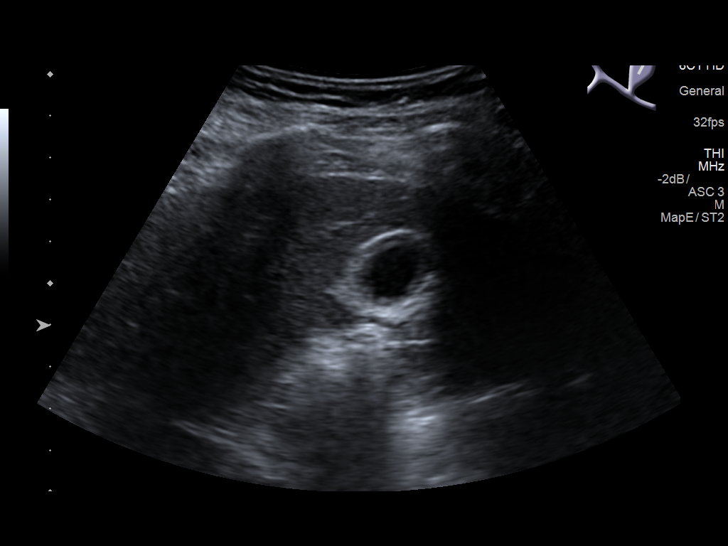
[im 17/22]
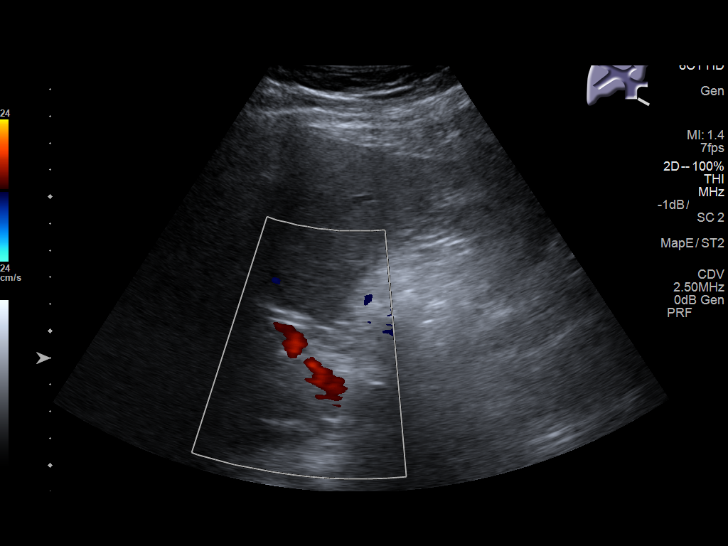
[im 19/22]
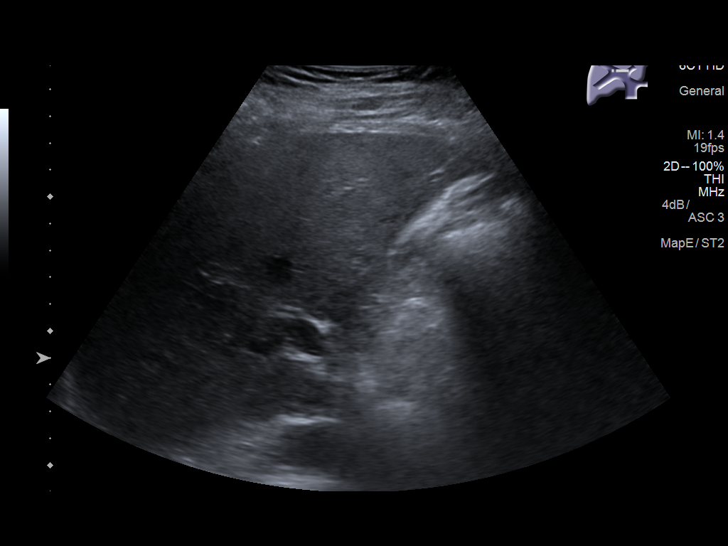
[im 20/22]
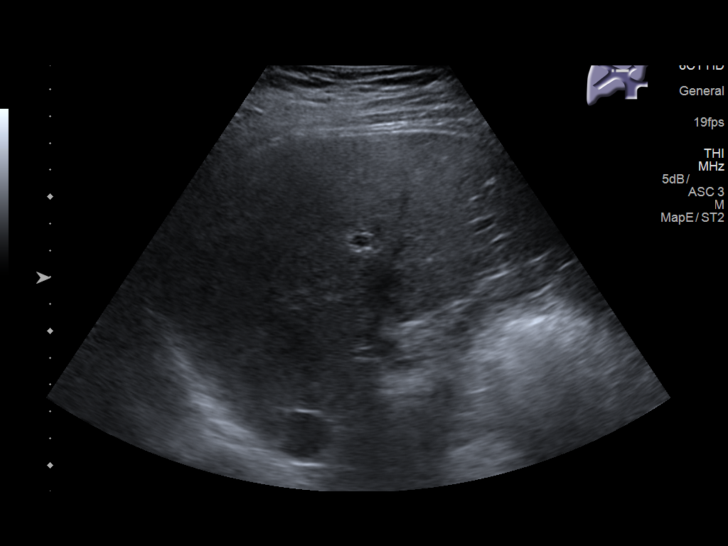
[im 22/22]
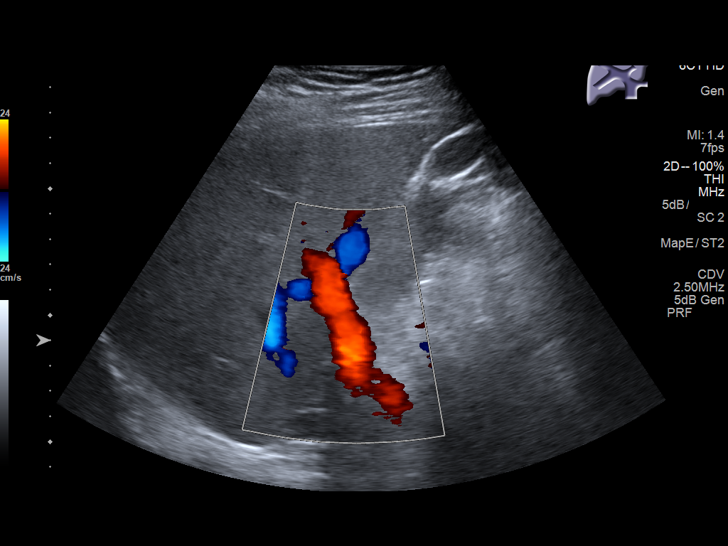

[14 of 22 positions shown; findings below may reference images not displayed]

FINDINGS: Gallbladder:

The gallbladder is contracted limiting evaluation. However, no
stones, sludge, wall thickening, pericholecystic fluid, or Murphy's
sign appreciated.

Common bile duct:

Diameter: 2 mm

Liver:

The left hepatic lobe is not well seen due to shadowing bowel gas.
Increased diffuse hepatic echogenicity. No focal mass. Portal vein
is patent on color Doppler imaging with normal direction of blood
flow towards the liver.
IMPRESSION: 1. The gallbladder is contracted limiting evaluation. However, no
gallbladder abnormalities are noted.
2. The left hepatic lobe is not well seen due to shadowing bowel
gas. Probable hepatic steatosis.

## 2020-03-13 ENCOUNTER — Ambulatory Visit: Payer: No Typology Code available for payment source

## 2020-03-13 ENCOUNTER — Other Ambulatory Visit: Payer: Self-pay

## 2020-03-13 DIAGNOSIS — Q231 Congenital insufficiency of aortic valve: Secondary | ICD-10-CM

## 2020-03-28 ENCOUNTER — Other Ambulatory Visit: Payer: Self-pay

## 2020-03-28 MED ORDER — METOPROLOL TARTRATE 25 MG PO TABS: 25.0000 mg | ORAL_TABLET | Freq: Two times a day (BID) | ORAL | 1 refills | Status: DC

## 2020-03-28 NOTE — Telephone Encounter (Signed)
Discussed with the patient. He has had fast heart rate in 100s since he took the monitor off. Will start metoprolol tartarate 25 mg bid. Recommend ENT evaluation for his dizziness.   Please send 60 pillsX3 refills.  Thanks MJP

## 2020-03-29 NOTE — Telephone Encounter (Signed)
Discussed.

## 2020-04-20 ENCOUNTER — Telehealth: Payer: Self-pay

## 2020-04-20 NOTE — Telephone Encounter (Signed)
Called and spoke with pts cousin, pt is going to hold metoprolol and let us know if the symptoms get better.

## 2020-04-20 NOTE — Telephone Encounter (Signed)
Pts cousin called stating that the pts heart rate goes up to 149 and above, even when he is sitting. He states that this started after his cousin started Metoprolol tartrate. He said that when his cousin wakes up in the morning, he feels like he has been running all night. He has pressure in his chest and he doesn't feel like himself lately. He would like to know what he should do, and wants to  Know if he should continue taking the medication.

## 2020-04-20 NOTE — Telephone Encounter (Signed)
Workup with cardiac monitor did not show any arrhthymias previously. Okay to try and hold metoprolol to see if symptoms get better.   Thanks MJP

## 2020-05-22 ENCOUNTER — Other Ambulatory Visit: Payer: Self-pay | Admitting: Cardiology

## 2020-06-05 ENCOUNTER — Ambulatory Visit (INDEPENDENT_AMBULATORY_CARE_PROVIDER_SITE_OTHER): Payer: No Typology Code available for payment source | Admitting: Otolaryngology

## 2020-06-27 ENCOUNTER — Other Ambulatory Visit: Payer: Self-pay | Admitting: Cardiology

## 2020-08-30 ENCOUNTER — Encounter: Payer: Self-pay | Admitting: Cardiology

## 2020-08-30 ENCOUNTER — Ambulatory Visit: Payer: No Typology Code available for payment source | Admitting: Cardiology

## 2020-08-30 ENCOUNTER — Other Ambulatory Visit: Payer: Self-pay

## 2020-08-30 VITALS — BP 112/67 | HR 75 | Temp 98.4°F | Resp 16 | Ht 73.0 in | Wt 266.0 lb

## 2020-08-30 DIAGNOSIS — I351 Nonrheumatic aortic (valve) insufficiency: Secondary | ICD-10-CM | POA: Insufficient documentation

## 2020-08-30 DIAGNOSIS — I350 Nonrheumatic aortic (valve) stenosis: Secondary | ICD-10-CM

## 2020-08-30 DIAGNOSIS — I10 Essential (primary) hypertension: Secondary | ICD-10-CM

## 2020-08-30 DIAGNOSIS — Q231 Congenital insufficiency of aortic valve: Secondary | ICD-10-CM

## 2020-08-30 DIAGNOSIS — I712 Thoracic aortic aneurysm, without rupture, unspecified: Secondary | ICD-10-CM | POA: Insufficient documentation

## 2020-08-30 DIAGNOSIS — E782 Mixed hyperlipidemia: Secondary | ICD-10-CM

## 2020-08-30 NOTE — Progress Notes (Signed)
Patient referred by Elwyn Reach, MD for chest pain  Subjective:   Dustin [name], male    DOB: [birth year], 40 y.o.   MRN: [id number]   Chief Complaint  Patient presents with  . Hypertension  . Palpitations  . Follow-up    6 month  . Bicuspid aortic valve with ascending aorta 4.0 to 4.5 cm in     HPI  40 y.o. Dustin [name] male with bicuspid aortic stenosis with mild aortopathy, anomalous pulmonary venous drainage, family h/o early CAD  Patient is here today with his brother.  He denies any chest pain or shortness of breath.  He still reports tachycardia after minimal physical activity.  No significant arrhythmia noted on cardiac telemetry before.   Current Outpatient Medications on File Prior to Visit  Medication Sig Dispense Refill  . metoprolol tartrate (LOPRESSOR) 25 MG tablet TAKE 1 TABLET(25 MG) BY MOUTH TWICE DAILY 60 tablet 1   No current facility-administered medications on file prior to visit.    Cardiovascular and other pertinent studies:  EKG 08/30/2020: Sinus rhythm 70 bpm  Normal EKG  Mobile cardiac telemetry 13 days 03/01/2020 - 03/14/2020: Dominant rhythm: Sinus. HR 47-144 bpm. Avg HR 80 bpm. 1 episodes of atrial tachycardia at 141 bpm for 4 beats. Rare isolated SVE, couplets. Rare isolated VE, couplets No atrial fibrillation/atrial flutter/VT/high grade AV block, sinus pause >3sec noted. 18 patient triggered events correlated with sinus rhythm.   Cardiac CTA 02/22/2020: 1. Coronary calcium score of 0. This was 1st percentile for age, sex, and race matched control. 2. Normal coronary origin.  Right dominance. 3. CAD-RADS 0. No evidence of CAD (0%). Consider non-atherosclerotic causes of chest pain. 4. Bicuspid aortic valve with moderate to severe leaflet calcification that extends into the left ventricular outflow tract. Aortic valve calcium score 1448. 5. Mild ascending aortic dilation, maximal diameter 41 mm. 6. Abnormal pulmonary vein  drainage into the left atrium. 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  Echocardiogram 02/04/2020:  Left ventricle cavity is normal in size. Moderate concentric hypertrophy  of the left ventricle. Normal global wall motion. Normal LV systolic  function with EF 55%. Doppler evidence of grade I (impaired) diastolic  dysfunction, normal LAP.  Left atrial cavity is mildly dilated.  Likely bicuspid aortic valve with moderate calcification. Mild aortic  stenosis. Vmax 2.2 m/sec, mean PG 11 mmHg, AVA 2.2 cm2 by continuity  equation. Moderate (Grade II) aortic regurgitation.  Mild tricuspid regurgitation.  The aortic root is mildly dilated at 3.8 cm.  No evidence of pulmonary hypertension.  EKG 02/02/2020: Sinus rhythm 74 bpm  Recent labs: 01/25/2020: Glucose 93, BUN/Cr 16/0.80. EGFR 112. Na/K 138/4.7. Rest of the CMP normal H/H 14.8/45.7. MCV 80.7. Platelets 224 HbA1C 5.3% Chol 212, TG 125, HDL 50, LDL 137 TSH 2.1 normal    Review of Systems  Cardiovascular: Positive for dyspnea on exertion. Negative for chest pain, leg swelling, palpitations and syncope.         Vitals:   08/30/20 1335  BP: 112/67  Pulse: 75  Resp: 16  Temp: 98.4 F (36.9 C)  SpO2: 98%     Body mass index is 35.09 kg/m. Filed Weights   08/30/20 1335  Weight: 266 lb (120.7 kg)     Objective:   Physical Exam Vitals and nursing note reviewed.  Constitutional:      General: He is not in acute distress. Neck:  Vascular: No JVD.  Cardiovascular:     Rate and Rhythm: Normal rate and regular rhythm.     Heart sounds: Normal heart sounds. No murmur heard.   Pulmonary:     Effort: Pulmonary effort is normal.     Breath sounds: Normal breath sounds. No wheezing or rales.          Assessment & Recommendations:   40 y.o. Dustin [name] male with bicuspid aortic stenosis with mild  aortopathy, anomalous pulmonary venous drainage, family h/o early CAD  Bicuspid aortic stenosis, bicuspid aortopathy: Mild aortic stenosis, aortic root 4.1 cm. Continue serial echocardiogram and CT scan, repeat in 07/2021. Switch metoprolol to tartrate to atenolol 50 mg daily. Not on losartan given well-controlled blood pressure. I have recommended not to lift heavy weights.  Moderate aerobic activity is okay.  Palpitations: Patient is currently wearing apple watch.  Enrolled in cardio logs monitoring. Recommend atenolol 50 mg daily.  Follow-up in 1 year.    Nigel Mormon, MD Pager: [phone] Office: [phone]

## 2020-09-01 ENCOUNTER — Other Ambulatory Visit: Payer: Self-pay

## 2020-09-01 MED ORDER — ATENOLOL 50 MG PO TABS
50.0000 mg | ORAL_TABLET | Freq: Every day | ORAL | 3 refills | Status: DC
Start: 2020-09-01 — End: 2020-12-25

## 2020-09-23 ENCOUNTER — Other Ambulatory Visit: Payer: Self-pay | Admitting: Cardiology

## 2020-12-25 ENCOUNTER — Other Ambulatory Visit: Payer: Self-pay | Admitting: Cardiology

## 2021-04-18 ENCOUNTER — Other Ambulatory Visit: Payer: Self-pay

## 2021-04-18 ENCOUNTER — Other Ambulatory Visit: Payer: No Typology Code available for payment source

## 2021-04-18 ENCOUNTER — Ambulatory Visit: Payer: No Typology Code available for payment source

## 2021-04-18 DIAGNOSIS — Q231 Congenital insufficiency of aortic valve: Secondary | ICD-10-CM

## 2021-04-18 DIAGNOSIS — I712 Thoracic aortic aneurysm, without rupture, unspecified: Secondary | ICD-10-CM

## 2021-04-28 ENCOUNTER — Other Ambulatory Visit: Payer: Self-pay | Admitting: Cardiology

## 2021-05-02 ENCOUNTER — Ambulatory Visit: Payer: No Typology Code available for payment source | Admitting: Cardiology

## 2021-05-03 ENCOUNTER — Other Ambulatory Visit: Payer: Self-pay

## 2021-05-03 ENCOUNTER — Ambulatory Visit: Payer: No Typology Code available for payment source | Admitting: Cardiology

## 2021-05-03 ENCOUNTER — Encounter: Payer: Self-pay | Admitting: Cardiology

## 2021-05-03 VITALS — BP 108/55 | HR 57 | Temp 97.9°F | Ht 73.0 in | Wt 260.0 lb

## 2021-05-03 DIAGNOSIS — Q231 Congenital insufficiency of aortic valve: Secondary | ICD-10-CM

## 2021-05-03 DIAGNOSIS — E782 Mixed hyperlipidemia: Secondary | ICD-10-CM

## 2021-05-03 DIAGNOSIS — R5383 Other fatigue: Secondary | ICD-10-CM | POA: Insufficient documentation

## 2021-05-03 DIAGNOSIS — M7918 Myalgia, other site: Secondary | ICD-10-CM

## 2021-05-03 NOTE — Progress Notes (Signed)
Patient referred by Elwyn Reach, MD for chest pain  Subjective:   Dustin [name], male    DOB: [birth year], 41 y.o.   MRN: [id number]   Chief Complaint  Patient presents with   Fatigue   Results   Chest Pain   Arm Pain     HPI  41 y.o. [name] male with bicuspid aortic stenosis with mild aortopathy, anomalous pulmonary venous drainage, family h/o early CAD  Patient has recently felt fatigued and tired. He has shoulder and arm pain, unrelated to exertion. Reviewed recent lab results wit the patient, details below.    Current Outpatient Medications on File Prior to Visit  Medication Sig Dispense Refill   atenolol (TENORMIN) 50 MG tablet TAKE 1 TABLET(50 MG) BY MOUTH DAILY 30 tablet 3   No current facility-administered medications on file prior to visit.    Cardiovascular and other pertinent studies:  EKG 05/03/2021: Sinus rhythm 57 bpm Normal EKG  Echocardiogram 04/18/2021:  Study Quality: Technically difficult study.  Normal LV systolic function with visual EF 60-65%. Left ventricle cavity  is normal in size. Mild left ventricular hypertrophy. Normal global wall  motion. Normal diastolic filling pattern, normal LAP.  The aortic valve is not well visualized.  Mild aortic stenosis. Moderate (Grade II) aortic regurgitation.  Aortic root measures 52m and proximal ascending aorta not well  visualized.  Compared to study 02/04/2020 LVEF / AS/ AR remain stable otherwise no  significant change.   Mobile cardiac telemetry 13 days 03/01/2020 - 03/14/2020: Dominant rhythm: Sinus. HR 47-144 bpm. Avg HR 80 bpm. 1 episodes of atrial tachycardia at 141 bpm for 4 beats. Rare isolated SVE, couplets. Rare isolated VE, couplets No atrial fibrillation/atrial flutter/VT/high grade AV block, sinus pause >3sec noted. 18 patient triggered events correlated with sinus rhythm.   Cardiac CTA 02/22/2020: 1. Coronary calcium score of 0. This was 1st percentile for age, sex, and  race matched control. 2. Normal coronary origin.  Right dominance. 3. CAD-RADS 0. No evidence of CAD (0%). Consider non-atherosclerotic causes of chest pain. 4. Bicuspid aortic valve with moderate to severe leaflet calcification that extends into the left ventricular outflow tract. Aortic valve calcium score 1448. 5. Mild ascending aortic dilation, maximal diameter 41 mm. 6. 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  Echocardiogram 02/04/2020:  Left ventricle cavity is normal in size. Moderate concentric hypertrophy  of the left ventricle. Normal global wall motion. Normal LV systolic  function with EF 55%. Doppler evidence of grade I (impaired) diastolic  dysfunction, normal LAP.  Left atrial cavity is mildly dilated.  Likely bicuspid aortic valve with moderate calcification.  Mild aortic  stenosis. Vmax 2.2 m/sec, mean PG 11 mmHg, AVA 2.2 cm2 by continuity  equation. Moderate (Grade II) aortic regurgitation.  Mild tricuspid regurgitation.  The aortic root is mildly dilated at 3.8 cm.  No evidence of pulmonary hypertension.  EKG 02/02/2020: Sinus rhythm 74 bpm  Recent labs: 04/16/2021: Glucose 125, BUN/Cr 12/0.84. EGFR 112. Na/K 140/4.5. Rest of the CMP normal H/H 15/48. MCV 79. Platelets 229. RBC count 10.04 (4.2-5.8) HbA1C 6.3% Chol 223, TG 380, HDL 48, LDL 141  01/25/2020: Glucose 93, BUN/Cr 16/0.80. EGFR 112. Na/K 138/4.7. Rest of the CMP normal H/H 14.8/45.7. MCV 80.7. Platelets 224 HbA1C 5.3% Chol 212, TG 125, HDL 50, LDL 137 TSH 2.1 normal    Review of  Systems  Cardiovascular:  Positive for dyspnea on exertion. Negative for chest pain, leg swelling, palpitations and syncope.        Vitals:   05/03/21 1141  BP: (!) 108/55  Pulse: (!) 57  Temp: 97.9 F (36.6 C)  SpO2: 96%     Body mass  index is 34.3 kg/m. Filed Weights   05/03/21 1141  Weight: 260 lb (117.9 kg)     Objective:   Physical Exam Vitals and nursing note reviewed.  Constitutional:      General: He is not in acute distress. Neck:     Vascular: No JVD.  Cardiovascular:     Rate and Rhythm: Normal rate and regular rhythm.     Heart sounds: Normal heart sounds. No murmur heard. Pulmonary:     Effort: Pulmonary effort is normal.     Breath sounds: Normal breath sounds. No wheezing or rales.         Assessment & Recommendations:   41 y.o. [name] male with bicuspid aortic stenosis with mild aortopathy, anomalous pulmonary venous drainage, family h/o early CAD  Shoulder/arm pain: Likely musculoskeletal.   Fatigue: He shows me his labs today with normal Hb, low MCV< high RBC count. I have encourages him to discuss this with PCP Dr Jonelle Sidle and consider hematology consult, if necessary.   Bicuspid aortic stenosis, bicuspid aortopathy: Mild aortic stenosis, aortic root 4.1 cm. Continue serial echocardiogram and CT scan, repeat in 07/2021. Continue atenolol 50 mg daily. Not on losartan given well-controlled blood pressure. I have recommended not to lift heavy weights.  Moderate aerobic activity is okay.  Mixed hyperlipidemia: TG 380, LDL 137. He tells me that he has made significant changes to his diet. Has cut down meat, rice. Repeat fasting lipid panel in 3 months  F/u in 1 year    Nigel Mormon, MD Pager: [phone] Office: [phone]

## 2021-08-30 ENCOUNTER — Other Ambulatory Visit: Payer: Self-pay | Admitting: Cardiology

## 2021-09-15 ENCOUNTER — Ambulatory Visit: Payer: Self-pay | Admitting: Internal Medicine

## 2021-10-01 ENCOUNTER — Ambulatory Visit: Payer: No Typology Code available for payment source | Admitting: Cardiology

## 2021-10-01 ENCOUNTER — Encounter: Payer: Self-pay | Admitting: Cardiology

## 2021-10-01 VITALS — BP 127/62 | HR 66 | Temp 98.4°F | Resp 16 | Ht 73.0 in | Wt 242.0 lb

## 2021-10-01 DIAGNOSIS — Q231 Congenital insufficiency of aortic valve: Secondary | ICD-10-CM

## 2021-10-01 DIAGNOSIS — I7121 Aneurysm of the ascending aorta, without rupture: Secondary | ICD-10-CM

## 2021-10-01 NOTE — Progress Notes (Signed)
Patient referred by Elwyn Reach, MD for chest pain  Subjective:   Dustin [name], male    DOB: 1980-12-26, 41 y.o.   MRN: [id number]   Chief Complaint  Patient presents with   Bicuspid aortic valve with ascending aorta 4.0 to 4.5 cm in   Follow-up    6 month     HPI  41 y.o. [name] male with bicuspid aortic stenosis with mild aortopathy, anomalous pulmonary venous drainage, family h/o early CAD  Patient is doing well, denies any recent cardiac symptoms.    Current Outpatient Medications:    atenolol (TENORMIN) 50 MG tablet, TAKE 1 TABLET(50 MG) BY MOUTH DAILY, Disp: 30 tablet, Rfl: 3  Cardiovascular and other pertinent studies:  EKG 05/03/2021: Sinus rhythm 57 bpm Normal EKG  Echocardiogram 04/18/2021:  Study Quality: Technically difficult study.  Normal LV systolic function with visual EF 60-65%. Left ventricle cavity  is normal in size. Mild left ventricular hypertrophy. Normal global wall  motion. Normal diastolic filling pattern, normal LAP.  The aortic valve is not well visualized.  Mild aortic stenosis. Moderate (Grade II) aortic regurgitation.  Aortic root measures 19m and proximal ascending aorta not well  visualized.  Compared to study 02/04/2020 LVEF / AS/ AR remain stable otherwise no  significant change.   Mobile cardiac telemetry 13 days 03/01/2020 - 03/14/2020: Dominant rhythm: Sinus. HR 47-144 bpm. Avg HR 80 bpm. 1 episodes of atrial tachycardia at 141 bpm for 4 beats. Rare isolated SVE, couplets. Rare isolated VE, couplets No atrial fibrillation/atrial flutter/VT/high grade AV block, sinus pause >3sec noted. 18 patient triggered events correlated with sinus rhythm.   Cardiac CTA 02/22/2020: 1. Coronary calcium score of 0. This was 1st percentile for age, sex, and race matched control. 2. Normal coronary origin.  Right dominance. 3. CAD-RADS 0. No evidence of CAD (0%). Consider non-atherosclerotic causes of chest pain. 4. Bicuspid  aortic valve with moderate to severe leaflet calcification that extends into the left ventricular outflow tract. Aortic valve calcium score 1448. 5. Mild ascending aortic dilation, maximal diameter 41 mm. 6. 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 Recent labs: 04/16/2021: Glucose 125, BUN/Cr 12/0.84. EGFR 112. Na/K 140/4.5. Rest of the CMP normal H/H 15/48. MCV 79. Platelets 229. RBC count 10.04 (4.2-5.8) HbA1C 6.3% Chol 223, TG 380, HDL 48, LDL 141  01/25/2020: Glucose 93, BUN/Cr 16/0.80. EGFR 112. Na/K 138/4.7. Rest of the CMP normal H/H 14.8/45.7. MCV 80.7. Platelets 224 HbA1C 5.3% Chol 212, TG 125, HDL 50, LDL 137 TSH 2.1 normal    Review of Systems  Cardiovascular:  Positive for dyspnea on exertion. Negative for chest pain, leg swelling, palpitations and syncope.         Vitals:   10/01/21 1130  BP: 127/62  Pulse: 66  Resp: 16  Temp: 98.4 F (36.9 C)  SpO2: 98%     Body mass index is 31.93 kg/m. Filed Weights   10/01/21 1130  Weight: 242 lb (109.8 kg)     Objective:   Physical Exam Vitals and nursing note reviewed.  Constitutional:      General: He is not in acute distress. Neck:     Vascular: No JVD.  Cardiovascular:     Rate and Rhythm: Normal rate and regular rhythm.     Heart sounds: Normal heart sounds. No murmur heard. Pulmonary:  Effort: Pulmonary effort is normal.     Breath sounds: Normal breath sounds. No wheezing or rales.          Assessment & Recommendations:   41 y.o. [name] male with bicuspid aortic stenosis with mild aortopathy, anomalous pulmonary venous drainage, family h/o early CAD  Bicuspid aortic stenosis, bicuspid aortopathy: Mild aortic stenosis, aortic root 4.1 cm. Continue serial echocardiogram and CT scan. Will check CTA aorta now, repeat  echcoardiogram in 1 year. Continue atenolol 50 mg daily. Not on losartan given well-controlled blood pressure. I have recommended not to lift heavy weights.  Moderate aerobic activity is okay.  Mixed hyperlipidemia: TG improved. LDL remains in 140s. He wants to hold off statin, as is he continues to make diet and lifestyle changes.    F/u in 1 year    Nigel Mormon, MD Pager: [phone] Office: [phone]

## 2021-10-25 ENCOUNTER — Other Ambulatory Visit: Payer: No Typology Code available for payment source

## 2021-10-26 ENCOUNTER — Ambulatory Visit
Admission: RE | Admit: 2021-10-26 | Discharge: 2021-10-26 | Disposition: A | Discharge status: Home / Self Care | Payer: 59 | Source: Ambulatory Visit | Attending: Cardiology | Admitting: Cardiology

## 2021-10-26 DIAGNOSIS — I7121 Aneurysm of the ascending aorta, without rupture: Secondary | ICD-10-CM

## 2021-10-26 MED ORDER — IOPAMIDOL (ISOVUE-370) INJECTION 76%
75.0000 mL | Freq: Once | INTRAVENOUS | Status: AC | PRN
  Administered 2021-10-26: 75 mL via INTRAVENOUS

## 2021-12-26 ENCOUNTER — Other Ambulatory Visit: Payer: Self-pay | Admitting: Cardiology

## 2022-04-30 ENCOUNTER — Other Ambulatory Visit: Payer: Self-pay | Admitting: Cardiology

## 2022-06-27 ENCOUNTER — Ambulatory Visit: Payer: 59 | Admitting: Cardiology

## 2022-06-27 ENCOUNTER — Encounter: Payer: Self-pay | Admitting: Cardiology

## 2022-06-27 VITALS — BP 143/67 | HR 61 | Resp 16 | Ht 73.0 in | Wt 263.0 lb

## 2022-06-27 DIAGNOSIS — Q231 Congenital insufficiency of aortic valve: Secondary | ICD-10-CM

## 2022-06-27 DIAGNOSIS — R072 Precordial pain: Secondary | ICD-10-CM

## 2022-06-27 DIAGNOSIS — I351 Nonrheumatic aortic (valve) insufficiency: Secondary | ICD-10-CM

## 2022-06-27 NOTE — Progress Notes (Signed)
Patient referred by Elwyn Reach, MD for chest pain  Subjective:   Dustin [name], male    DOB: 06-04-1980, 42 y.o.   MRN: [id number]   Chief Complaint  Patient presents with   Chest Pain     HPI  42 y.o. [name] male with bicuspid aortic stenosis with mild aortopathy, anomalous pulmonary venous drainage, family h/o early CAD  Patient made urgent appointment today. He has been having left sided focal chest pain, lasting for several hours, present at est, present for last two months. There are no specific aggravating/relieving factors. Blood pressure slightly elevated today. He denies any exertional dyspnea.    Current Outpatient Medications:    atenolol (TENORMIN) 50 MG tablet, TAKE 1 TABLET(50 MG) BY MOUTH DAILY, Disp: 30 tablet, Rfl: 3  Cardiovascular and other pertinent studies:  EKG 06/27/2022: Sinus rhythm 62 bpm  Left ventricular hypertrophy ST-T changes likely related to LVH, more prominent compared to previous EKG on 10/01/2021  Echocardiogram 04/18/2021:  Study Quality: Technically difficult study.  Normal LV systolic function with visual EF 60-65%. Left ventricle cavity  is normal in size. Mild left ventricular hypertrophy. Normal global wall  motion. Normal diastolic filling pattern, normal LAP.  The aortic valve is not well visualized.  Mild aortic stenosis. Moderate (Grade II) aortic regurgitation.  Aortic root measures 60mm and proximal ascending aorta not well  visualized.  Compared to study 02/04/2020 LVEF / AS/ AR remain stable otherwise no  significant change.   Mobile cardiac telemetry 13 days 03/01/2020 - 03/14/2020: Dominant rhythm: Sinus. HR 47-144 bpm. Avg HR 80 bpm. 1 episodes of atrial tachycardia at 141 bpm for 4 beats. Rare isolated SVE, couplets. Rare isolated VE, couplets No atrial fibrillation/atrial flutter/VT/high grade AV block, sinus pause >3sec noted. 18 patient triggered events correlated with sinus rhythm.   Cardiac CTA  02/22/2020: 1. Coronary calcium score of 0. This was 1st percentile for age, sex, and race matched control. 2. Normal coronary origin.  Right dominance. 3. CAD-RADS 0. No evidence of CAD (0%). Consider non-atherosclerotic causes of chest pain. 4. Bicuspid aortic valve with moderate to severe leaflet calcification that extends into the left ventricular outflow tract. Aortic valve calcium score 1448. 5. Mild ascending aortic dilation, maximal diameter 41 mm. 6. 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  Recent labs: 04/16/2021: Glucose 125, BUN/Cr 12/0.84. EGFR 112. Na/K 140/4.5. Rest of the CMP normal H/H 15/48. MCV 79. Platelets 229. RBC count 10.04 (4.2-5.8) HbA1C 6.3% Chol 223, TG 380, HDL 48, LDL 141  01/25/2020: Glucose 93, BUN/Cr 16/0.80. EGFR 112. Na/K 138/4.7. Rest of the CMP normal H/H 14.8/45.7. MCV 80.7. Platelets 224 HbA1C 5.3% Chol 212, TG 125, HDL 50, LDL 137 TSH 2.1 normal    Review of Systems  Cardiovascular:  Positive for chest pain. Negative for dyspnea on exertion, leg swelling, palpitations and syncope.         Vitals:   06/27/22 1007  BP: (!) 143/67  Pulse: 61  Resp: 16  SpO2: 97%     Body mass index is 34.7 kg/m. Filed Weights   06/27/22 1007  Weight: 263 lb (119.3 kg)     Objective:   Physical Exam Vitals and nursing note reviewed.  Constitutional:      General: He is not in acute distress. Neck:     Vascular: No JVD.  Cardiovascular:  Rate and Rhythm: Normal rate and regular rhythm.     Heart sounds: Murmur heard.     Dustin midsystolic murmur is present with a grade of 1/6 at the upper right sternal border radiating to the neck.     High-pitched blowing decrescendo early diastolic murmur is present with a grade of 3/4 at the upper right sternal border radiating to  the apex.  Pulmonary:     Effort: Pulmonary effort is normal.     Breath sounds: Normal breath sounds. No wheezing or rales.  Musculoskeletal:     Right lower leg: No edema.     Left lower leg: No edema.          Assessment & Recommendations:   42 y.o. [name] male with bicuspid aortic stenosis with mild aortopathy, anomalous pulmonary venous drainage, family h/o early CAD  Chest pain: Very noncardiac by history.  Calcium score 0 in 01/2020. We could consider stress testing, but before that, I would like to check echcoardigram to evaluate his aortic valve. Details below.  Check troponin and d-Dimer.  Bicuspid aortic stenosis, bicuspid aortopathy: Mild aortic stenosis, aortic root 4.1 cm. His aortic insufficiency murmur is more prominent today. Check echocardiogram. Will also check CTA aorta. He has bilaterally equal radial pulses. He does not have any symptoms s/o acute aortic syndrome.  Continue atenolol 50 mg daily. BP 140.70 mmHg. May add further medications after echocardiogram, CTA.  Mixed hyperlipidemia: TG improved. LDL remains in 140s.  Historically, he has wanted to hold off statin, as is he continues to make diet and lifestyle changes.    F/u in 3-4 weeks    Nigel Mormon, MD Pager: [phone] Office: [phone]

## 2022-06-28 LAB — CBC
Hematocrit: 46 % (ref 37.5–51.0)
Hemoglobin: 14.7 g/dL (ref 13.0–17.7)
MCH: 25.7 pg — ABNORMAL LOW (ref 26.6–33.0)
MCHC: 32 g/dL (ref 31.5–35.7)
MCV: 81 fL (ref 79–97)
Platelets: 231 10*3/uL (ref 150–450)
RBC: 5.71 x10E6/uL (ref 4.14–5.80)
RDW: 13.4 % (ref 11.6–15.4)
WBC: 7 10*3/uL (ref 3.4–10.8)

## 2022-06-28 LAB — BASIC METABOLIC PANEL
BUN/Creatinine Ratio: 20 (ref 9–20)
BUN: 19 mg/dL (ref 6–24)
CO2: 23 mmol/L (ref 20–29)
Calcium: 9.5 mg/dL (ref 8.7–10.2)
Chloride: 102 mmol/L (ref 96–106)
Creatinine, Ser: 0.97 mg/dL (ref 0.76–1.27)
Glucose: 105 mg/dL — ABNORMAL HIGH (ref 70–99)
Potassium: 5.1 mmol/L (ref 3.5–5.2)
Sodium: 136 mmol/L (ref 134–144)
eGFR: 100 mL/min/{1.73_m2} (ref >59)

## 2022-06-28 LAB — D-DIMER, QUANTITATIVE: D-DIMER: 0.45 mg/L FEU (ref 0.00–0.49)

## 2022-06-28 LAB — TROPONIN T: Troponin T (Highly Sensitive): <6 ng/L (ref 0–22)

## 2022-07-02 ENCOUNTER — Ambulatory Visit: Payer: No Typology Code available for payment source

## 2022-07-02 DIAGNOSIS — Q231 Congenital insufficiency of aortic valve: Secondary | ICD-10-CM

## 2022-07-14 IMAGING — CT CT HEART MORP W/ CTA COR W/ SCORE W/ CA W/CM &/OR W/O CM
1 series · 6 of 20 positions shown, 8 images · non-contrast
Comparison: 01/13/2020 chest radiograph
COMPARISON: 01/13/2020 chest radiograph

Addendum:
EXAM:
OVER-READ INTERPRETATION  CT CHEST

The following report is an over-read performed by radiologist Dr.
over-read does not include interpretation of cardiac or coronary
anatomy or pathology. The coronary CTA interpretation by the
cardiologist is attached.
CLINICAL DATA: 39 Year-old Lebanese Male
Demographic assessment for MESA uses Asian subset
Cardiac/Coronary  CTA
TECHNIQUE: The patient was scanned on a [employer] Force scanner.

[Series 293: — · 0.45mm/px · 6 of 22 slices shown, 8 images]
[im 3/22  vessel]
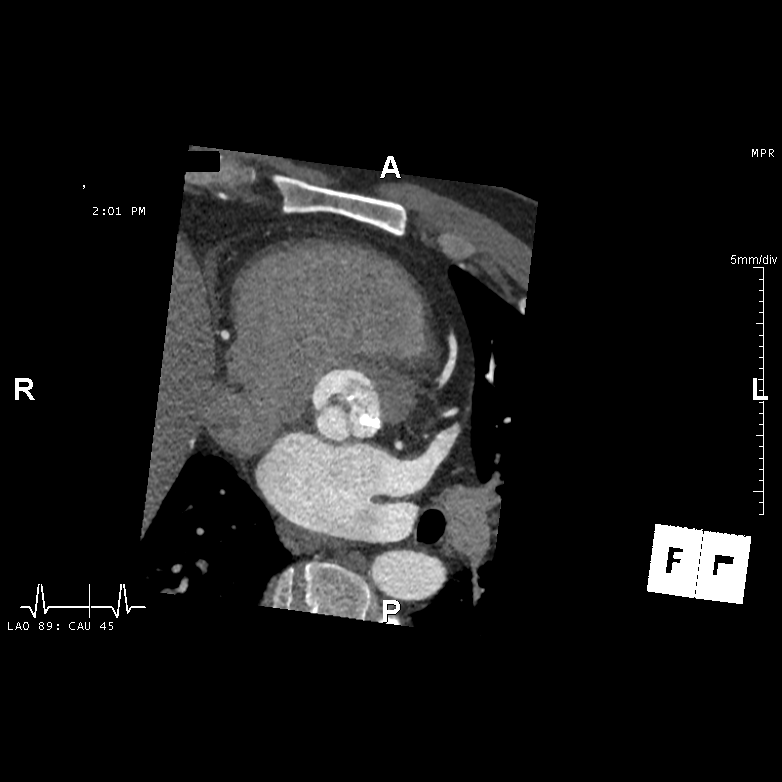
[im 3/22  lung]
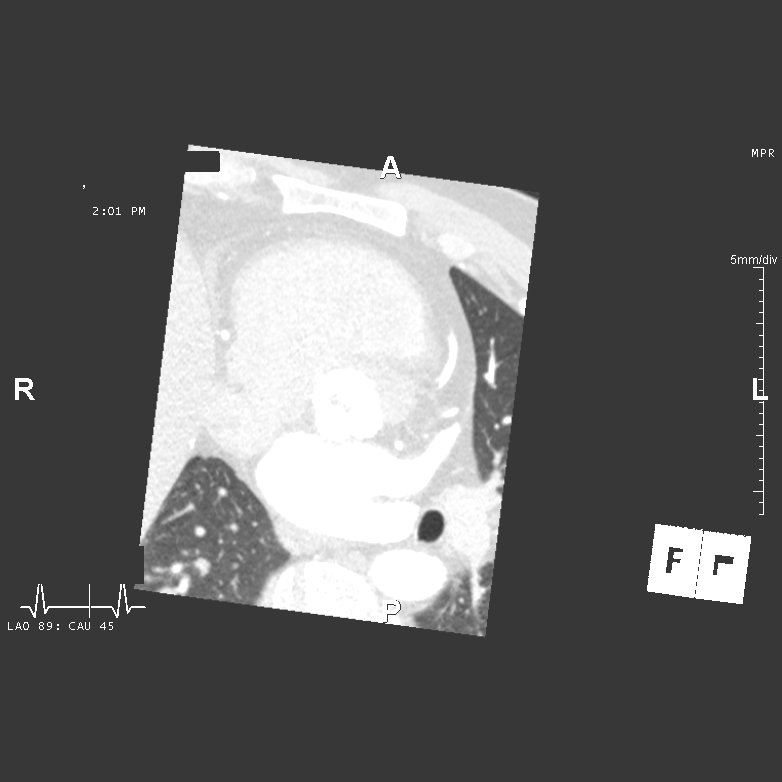
[im 6/22  vessel]
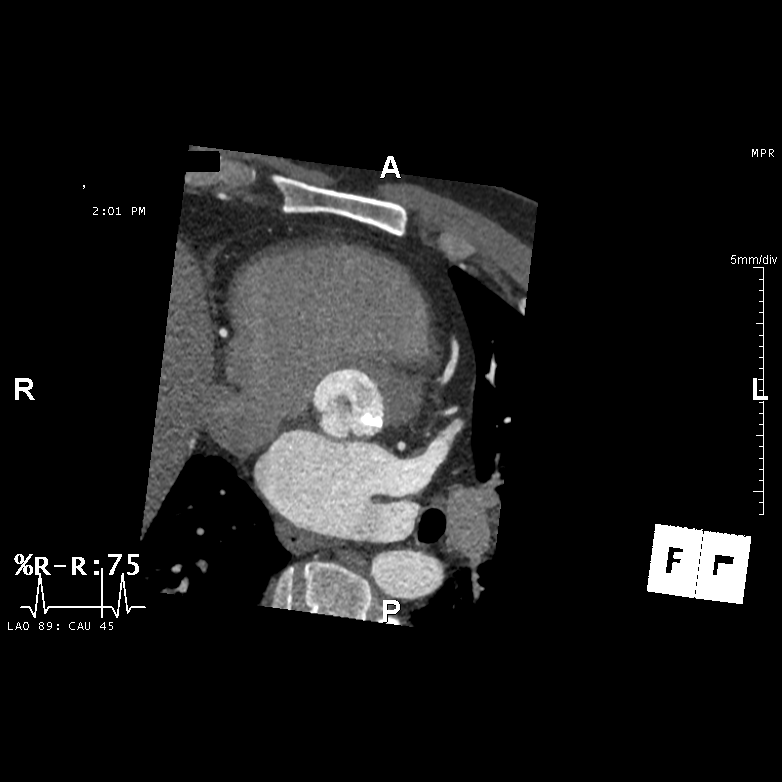
[im 9/22  vessel]
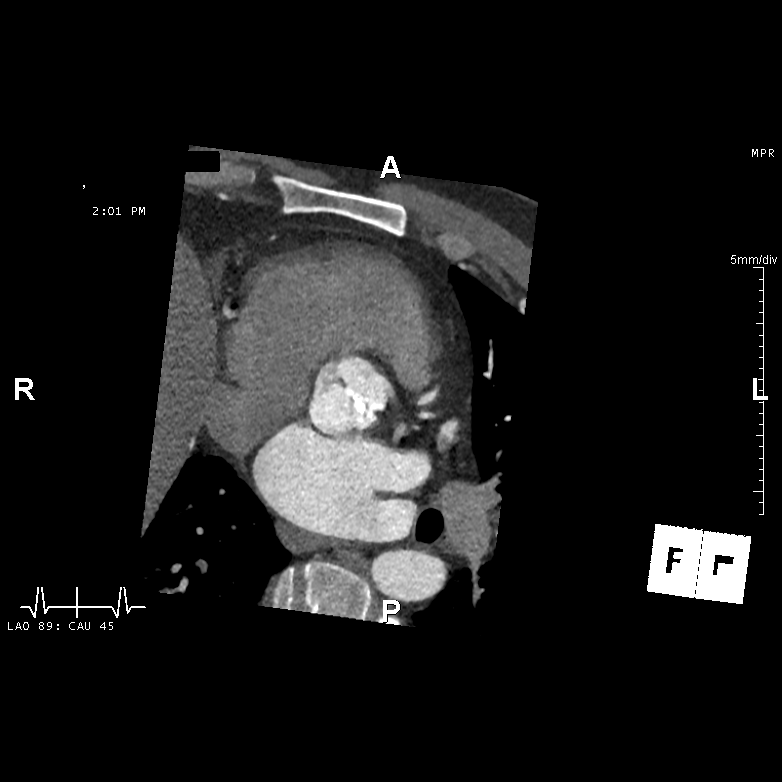
[im 13/22  vessel]
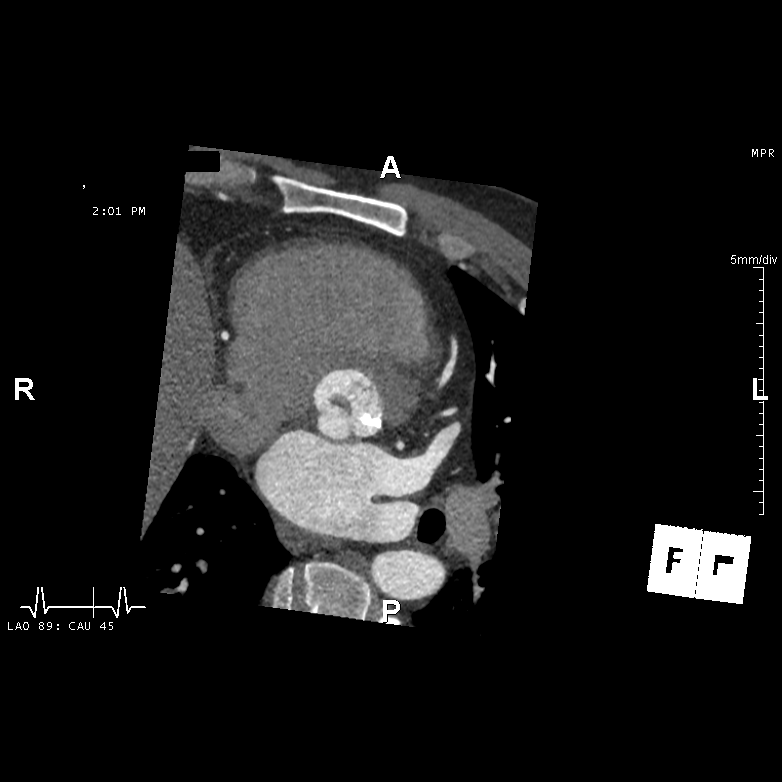
[im 16/22  vessel]
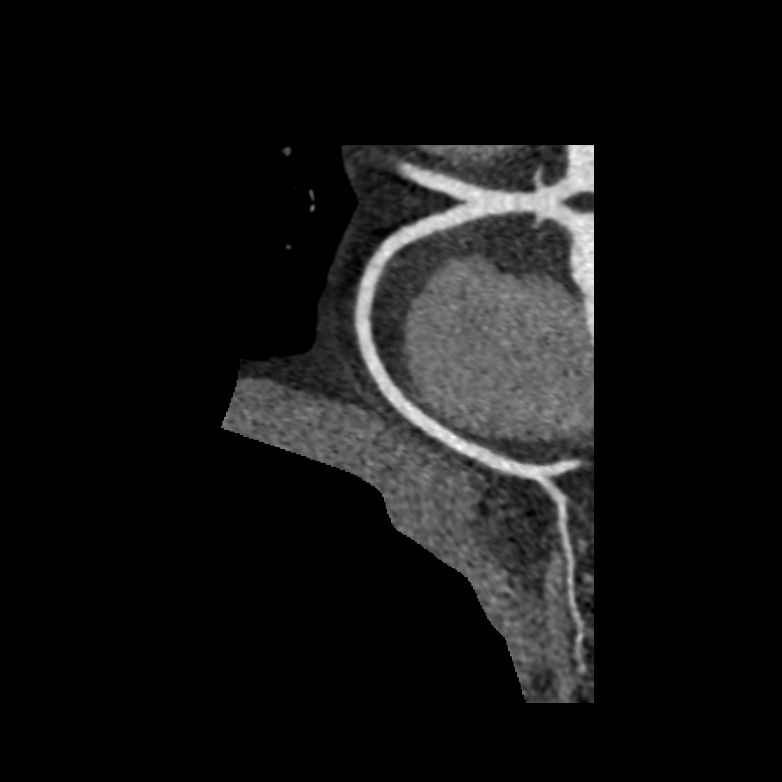
[im 16/22  lung]
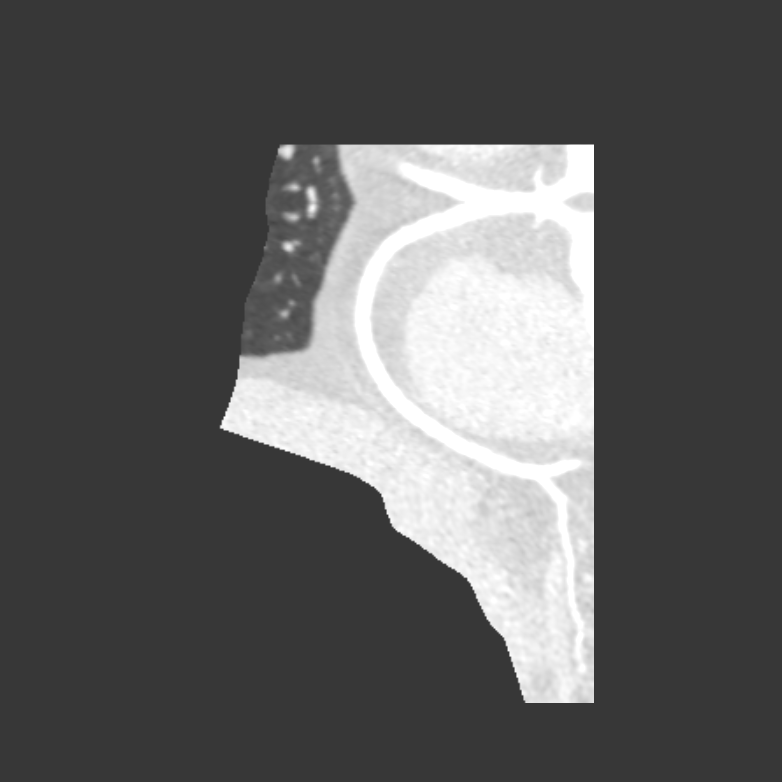
[im 19/22  vessel]
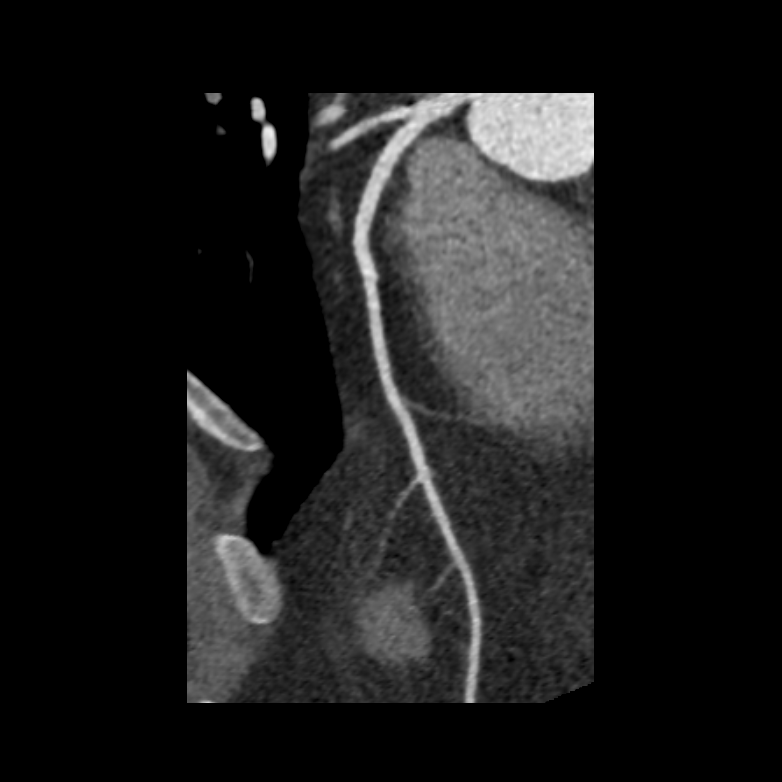

[6 of 20 positions shown; findings below may reference images not displayed]

FINDINGS: Vascular: Normal aortic caliber. No central pulmonary embolism, on
this non-dedicated study.

Mediastinum/Nodes: No imaged thoracic adenopathy.

Lungs/Pleura: No pleural fluid.  Clear imaged lungs.

Upper Abdomen: Normal imaged portions of the liver, spleen.

Musculoskeletal: No acute osseous abnormality.
IMPRESSION: No acute findings in the imaged extracardiac chest.
FINDINGS: A 100 kV prospective scan was triggered in the descending thoracic
aorta at 111 HU's. Axial non-contrast 3 mm slices were carried out
through the heart. The data set was analyzed on a dedicated work
station and scored using the Agatson method. Gantry rotation speed
was 250 msecs and collimation was .6 mm. No beta blockade and 0.8 mg
of sl NTG was given. The 3D data set was reconstructed in 5%
intervals of the 67-82 % of the R-R cycle. Diastolic phases were
analyzed on a dedicated work station using MPR, MIP and VRT modes.
The patient received 80 cc of contrast.

Aorta: Mild ascending aortic dilation, maximal diameter 41 mm. No
calcifications. No dissection.

Aortic Valve: Morphology consistent with bicuspid aortic valve.
Moderate to severe leaflet calcification that extends into the left
ventricular outflow tract. Aortic valve calcium score 7111.

Coronary Arteries:  Normal coronary origin.  Right dominance.

Coronary calcium score of 0. This was 1st percentile for age, sex,
and race matched control.

RCA is a large dominant artery that gives rise to PDA and PLA. There
is no plaque.

Left main is a large artery that gives rise to LAD and LCX arteries.

LAD is a large vessel that gives rise to two diagonal branches.
There is no plaque.

LCX is a non-dominant artery that gives rise to one large OM1
branch. There is no plaque.

Other findings:

Abnormal pulmonary vein drainage into the left atrium- there are
three right sided pulmonary veins (abnormal presence of right middle
pulmonary vein).

Normal left atrial appendage without a thrombus.

Normal size of the pulmonary artery.

Extra-cardiac findings: See attached radiology report for
non-cardiac structures.
IMPRESSION: 1. Coronary calcium score of 0. This was 1st percentile for age,
sex, and race matched control.

2. Normal coronary origin.  Right dominance.

3. CAD-RADS 0. No evidence of CAD (0%). Consider non-atherosclerotic
causes of chest pain.

4. Bicuspid aortic valve with moderate to severe leaflet
calcification that extends into the left ventricular outflow tract.
Aortic valve calcium score 7111.

5. Mild ascending aortic dilation, maximal diameter 41 mm.

6. Abnormal pulmonary vein drainage into the left atrium as above.

Will reach out to primary cardiologist regarding findings.

*** End of Addendum ***
EXAM:
OVER-READ INTERPRETATION  CT CHEST

The following report is an over-read performed by radiologist Dr.
over-read does not include interpretation of cardiac or coronary
anatomy or pathology. The coronary CTA interpretation by the
cardiologist is attached.
FINDINGS: Vascular: Normal aortic caliber. No central pulmonary embolism, on
this non-dedicated study.

Mediastinum/Nodes: No imaged thoracic adenopathy.

Lungs/Pleura: No pleural fluid.  Clear imaged lungs.

Upper Abdomen: Normal imaged portions of the liver, spleen.

Musculoskeletal: No acute osseous abnormality.
IMPRESSION: No acute findings in the imaged extracardiac chest.

## 2022-07-18 ENCOUNTER — Ambulatory Visit (HOSPITAL_COMMUNITY)
Admission: RE | Admit: 2022-07-18 | Discharge: 2022-07-18 | Disposition: A | Discharge status: Home / Self Care | Payer: 59 | Source: Ambulatory Visit | Attending: Cardiology | Admitting: Cardiology

## 2022-07-18 DIAGNOSIS — I351 Nonrheumatic aortic (valve) insufficiency: Secondary | ICD-10-CM | POA: Diagnosis present

## 2022-07-18 DIAGNOSIS — Q231 Congenital insufficiency of aortic valve: Secondary | ICD-10-CM | POA: Diagnosis present

## 2022-07-18 MED ORDER — IOHEXOL 350 MG/ML SOLN
75.0000 mL | Freq: Once | INTRAVENOUS | Status: AC | PRN
  Administered 2022-07-18: 75 mL via INTRAVENOUS

## 2022-08-02 ENCOUNTER — Encounter: Payer: Self-pay | Admitting: Cardiology

## 2022-08-02 ENCOUNTER — Ambulatory Visit: Payer: No Typology Code available for payment source | Admitting: Cardiology

## 2022-08-02 VITALS — BP 130/76 | HR 72 | Ht 72.0 in | Wt 262.0 lb

## 2022-08-02 DIAGNOSIS — I7121 Aneurysm of the ascending aorta, without rupture: Secondary | ICD-10-CM

## 2022-08-02 DIAGNOSIS — Q231 Congenital insufficiency of aortic valve: Secondary | ICD-10-CM

## 2022-08-02 DIAGNOSIS — I351 Nonrheumatic aortic (valve) insufficiency: Secondary | ICD-10-CM

## 2022-08-02 DIAGNOSIS — E782 Mixed hyperlipidemia: Secondary | ICD-10-CM

## 2022-08-02 NOTE — Progress Notes (Signed)
Patient referred by Rometta Emery, MD for chest pain  Subjective:   Dustin [name], [name]    DOB: 1981/03/01, 42 y.o.   MRN: [id number]   Chief Complaint  Patient presents with   Nonrheumatic aortic valve insufficiency   Follow-up     HPI  42 y.o. Dustin [name] with bicuspid aortic stenosis with mild aortopathy, anomalous pulmonary venous drainage, family h/o early CAD  Patient is doing well, denies any complaints.  Few days ago, he reported 40 pound weight, and felt lightheaded.  Other than that, he has no complaints.  Reviewed recent test results with the patient, details below.     Current Outpatient Medications:    atenolol (TENORMIN) 50 MG tablet, TAKE 1 TABLET(50 MG) BY MOUTH DAILY, Disp: 30 tablet, Rfl: 3  Cardiovascular and other pertinent studies:  EKG 08/02/2022: Sinus rhythm 63 bpm LVH ST-T changes likely due to LVH  EKG 06/27/2022: Sinus rhythm 62 bpm  Left ventricular hypertrophy ST-T changes likely related to LVH, more prominent compared to previous EKG on 10/01/2021  Echocardiogram 07/02/2022:  Normal LV systolic function with visual EF 60-65%. Left ventricle cavity  is normal in size. Mild concentric hypertrophy of the left ventricle.  Normal global wall motion. Doppler evidence of grade I (impaired)  diastolic dysfunction, normal LAP. Calculated EF 66%.  Left atrial cavity is moderately dilated at 45.1 ml/m^2.  Bicuspid aortic valve. Moderate aortic stenosis. Moderate to severe aortic  regurgitation. AVA (VTI) measures 1.8 cm^2. AV Mean Grad measures 23.7  mmHg. AV Pk Vel measures 3.56 m/s.  Structurally normal mitral valve.  Mild (Grade I) mitral regurgitation.  Structurally normal tricuspid valve with trace regurgitation. No evidence  of pulmonary hypertension.  Compared to 03/2021, AS and AI have progressed slightly.   CTA chest 07/18/2022: 1. 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 Recommend annual imaging followup by  CTA [name] MRA. This recommendation follows 2010 ACCF/AHA/AATS/ACR/ASA/SCA/SCAI/SIR/STS/SVM Guidelines for the Diagnosis and Management of Patients with Thoracic Aortic Disease. Circulation. 2010; 121: W098-J191. Aortic aneurysm NOS (ICD10-I71.9) 2. No evidence of dissection [name] pulmonary embolism. 3. Mild cardiomegaly.   Cardiac CTA 02/22/2020: 1. Coronary calcium score of 0. This was 1st percentile for age, sex, and race matched control. 2. Normal coronary origin.  Right dominance. 3. CAD-RADS 0. No evidence of CAD (0%). Consider non-atherosclerotic causes of chest pain. 4. Bicuspid aortic valve with moderate to severe leaflet calcification that extends into the left ventricular outflow tract. Aortic valve calcium score 1448. 5. Mild ascending aortic dilation, maximal diameter 41 mm. 6. 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  Recent labs: 06/27/2022: Glucose 105, BUN/Cr 19/0.97. EGFR 100. Na/K 136/5.1.  H/H 14/46. MCV 81. Platelets 231  04/16/2021: Glucose 125, BUN/Cr 12/0.84. EGFR 112. Na/K 140/4.5. Rest of the CMP normal H/H 15/48. MCV 79. Platelets 229. RBC count 10.04 (4.2-5.8) HbA1C 6.3% Chol 223, TG 380, HDL 48, LDL 141  01/25/2020: Glucose 93, BUN/Cr 16/0.80. EGFR 112. Na/K 138/4.7. Rest of the CMP normal H/H 14.8/45.7. MCV 80.7. Platelets 224 HbA1C 5.3% Chol 212, TG 125, HDL 50, LDL 137 TSH 2.1 normal    Review of Systems  Cardiovascular:  Negative for chest pain, dyspnea on exertion, leg swelling, palpitations and syncope.         Vitals:   08/02/22 1038  BP: 130/76  Pulse: 72  SpO2:  97%     Body mass index is 35.53 kg/m. Filed Weights   08/02/22 1038  Weight: 262 lb (118.8 kg)     Objective:   Physical Exam Vitals and nursing note reviewed.  Constitutional:      General: He is  not in acute distress. Neck:     Vascular: No JVD.  Cardiovascular:     Rate and Rhythm: Normal rate and regular rhythm.     Heart sounds: Murmur heard.     Harsh midsystolic murmur is present with a grade of 1/6 at the upper right sternal border radiating to the neck.     High-pitched blowing decrescendo early diastolic murmur is present with a grade of 3/4 at the upper right sternal border radiating to the apex.  Pulmonary:     Effort: Pulmonary effort is normal.     Breath sounds: Normal breath sounds. No wheezing [name] rales.  Musculoskeletal:     Right lower leg: No edema.     Left lower leg: No edema.          Assessment & Recommendations:   42 y.o. Dustin [name] with bicuspid aortic stenosis/regurgitation, bicuspid aortopathy,anomalous pulmonary venous drainage  Bicuspid aortopathy, AAS, AI: Significant worsening compared to previous echocardiogram in 2023, now with moderate AAS, moderate to severe AI, ascending aorta 4.2 cm. Avoid lifting heavy weights >20 pounds. I do not think he needs surgery at this time, but certainly needs to be monitored closely given progression of his ASN over the past 1 year. To that effect, I will refer him to cardiothoracic surgery to establish care with them. Will repeat echocardiogram, and CT aorta in 6 months. Continue atenolol for heart rate and blood pressure control.  Mixed hyperlipidemia: Calcium score 0. Will check lipid panel with next labs in 6 months.  F/u in 6 months    Elder Negus, MD Pager: [phone] Office: [phone]

## 2022-08-25 NOTE — Progress Notes (Deleted)
[address]       [place] [address]             [phone]           Zadok Groman [facility] Medical Record #[id number] Date of Birth: March 06, 1981  Elder Negus, MD Rometta Emery, MD  Chief Complaint:     History of Present Illness:           Past Medical History:  Diagnosis Date   Hyperlipidemia    Hypertension     Past Surgical History:  Procedure Laterality Date   CHOLECYSTECTOMY      Social History   Tobacco Use  Smoking Status Former   Packs/day: 0.25   Years: 1.00   Additional pack years: 0.00   Total pack years: 0.25   Types: Cigarettes   Quit date: 2015   Years since quitting: 9.4  Smokeless Tobacco Never    Social History   Substance and Sexual Activity  Alcohol Use Not Currently    Social History   Socioeconomic History   Marital status: Married    Spouse name: Not on file   Number of children: 2   Years of education: Not on file   Highest education level: Not on file  Occupational History   Not on file  Tobacco Use   Smoking status: Former    Packs/day: 0.25    Years: 1.00    Additional pack years: 0.00    Total pack years: 0.25    Types: Cigarettes    Quit date: 2015    Years since quitting: 9.4   Smokeless tobacco: Never  Vaping Use   Vaping Use: Never used  Substance and Sexual Activity   Alcohol use: Not Currently   Drug use: Never   Sexual activity: Not on file  Other Topics Concern   Not on file  Social History Narrative   Not on file   Social Determinants of Health   Financial Resource Strain: Not on file  Food Insecurity: Not on file  Transportation Needs: Not on file  Physical Activity: Not on file  Stress: Not on file  Social Connections: Not on file  Intimate Partner Violence: Not on file    No Known Allergies  Current Outpatient Medications  Medication Sig Dispense Refill   atenolol (TENORMIN) 50 MG tablet TAKE 1 TABLET(50 MG) BY MOUTH DAILY 30 tablet 3   No  current facility-administered medications for this visit.     Family History  Problem Relation Age of Onset   Hypertension Mother    Heart attack Father    Diabetes Father    Diabetes Sister    Hypertension Sister    Diabetes Brother    Hypertension Brother        Physical Exam:      Diagnostic Studies & Laboratory data: I have personally reviewed the following studies and agree with the findings   TTE (06/2022) Echocardiogram 07/02/2022:  Normal LV systolic function with visual EF 60-65%. Left ventricle cavity  is normal in size. Mild concentric hypertrophy of the left ventricle.  Normal global wall motion. Doppler evidence of grade I (impaired)  diastolic dysfunction, normal LAP. Calculated EF 66%.  Left atrial cavity is moderately dilated at 45.1 ml/m^2.  Structurally normal trileaflet aortic valve. Moderate aortic stenosis.  Moderate to severe aortic regurgitation. AVA (VTI) measures 1.8 cm^2. AV  Mean Grad measures 23.7 mmHg. AV Pk Vel measures 3.56 m/s.  Structurally normal mitral  valve.  Mild (Grade I) mitral regurgitation.  Structurally normal tricuspid valve with trace regurgitation. No evidence  of pulmonary hypertension.  Compared to 03/2021, AS and AI have progressed slightly.    Recent Radiology Findings:  CTA (07/2022) FINDINGS: Cardiovascular: The heart is mildly enlarged and there is no pericardial effusion. There is calcification of the aortic valve leaflet on the left. There is aneurysmal dilatation of the ascending aorta measuring 4.2 cm. No dissection is seen. Pulmonary trunk is normal in caliber. No pulmonary embolism is seen.   Mediastinum/Nodes: No enlarged mediastinal, hilar, or axillary lymph nodes. Thyroid gland, trachea, and esophagus demonstrate no significant findings.   Lungs/Pleura: Mild atelectasis bilaterally. No effusion or pneumothorax.   Upper Abdomen: No acute abnormality.   Musculoskeletal: No acute osseous abnormality.    Review of the MIP images confirms the above findings.   IMPRESSION: 1. 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 Recommend annual imaging followup by CTA or MRA. This recommendation follows 2010 ACCF/AHA/AATS/ACR/ASA/SCA/SCAI/SIR/STS/SVM Guidelines for the Diagnosis and Management of Patients with Thoracic Aortic Disease. Circulation. 2010; 121: Z610-R604. Aortic aneurysm NOS (ICD10-I71.9) 2. No evidence of dissection or pulmonary embolism. 3. Mild cardiomegaly.      Recent Lab Findings: Lab Results  Component Value Date   WBC 7.0 06/27/2022   HGB 14.7 06/27/2022   HCT 46.0 06/27/2022   PLT 231 06/27/2022   GLUCOSE 105 (H) 06/27/2022   ALT 19 01/25/2018   AST 20 01/25/2018   NA 136 06/27/2022   K 5.1 06/27/2022   CL 102 06/27/2022   CREATININE 0.97 06/27/2022   BUN 19 06/27/2022   CO2 23 06/27/2022      Assessment / Plan:        I have spent *** min in review of the records, viewing studies and in face to face with patient and in coordination of future care    Eugenio Hoes 08/25/2022 5:27 PM

## 2022-08-26 ENCOUNTER — Encounter: Payer: 59 | Admitting: Thoracic Surgery (Cardiothoracic Vascular Surgery)

## 2022-08-29 ENCOUNTER — Other Ambulatory Visit: Payer: Self-pay | Admitting: Cardiology

## 2022-09-15 NOTE — Progress Notes (Unsigned)
[address]       [place] [address]             [phone]           [facility] Medical Record #[id number] Date of Birth: 30-Nov-1980  Elder Negus, MD Rometta Emery, MD  Chief Complaint:  Bicuspid aortic stenosis/regurgitation   History of Present Illness:     42 yo le[place]ese male who is followed by Cardiology for bicuspid aortic disease. Pt was interviewed with his cousin on the phone to assist in the interpretation during our visit. Pt has minimal symptoms with occasional chest discomfort but no DOE or lower ext edema. He does get occasional postural lightheadedness if he leans over low and then rises but quickly resolves. Pt has had recent echo with EF 65% and no LV dilation but with moderate AS (mean gradient ) but with moderate to severe AI. Pt with small ascending aortic aneurysm at 4.2 cm on recent CTA of chest. Pt here to establish care to discuss potential AVR issues when needed in the future      Past Medical History:  Diagnosis Date   Hyperlipidemia    Hypertension     Past Surgical History:  Procedure Laterality Date   CHOLECYSTECTOMY      Social History   Tobacco Use  Smoking Status Former   Packs/day: 0.25   Years: 1.00   Additional pack years: 0.00   Total pack years: 0.25   Types: Cigarettes   Quit date: 2015   Years since quitting: 9.4  Smokeless Tobacco Never    Social History   Substance and Sexual Activity  Alcohol Use Not Currently    Social History   Socioeconomic History   Marital status: Married    Spouse name: Not on file   Number of children: 2   Years of education: Not on file   Highest education level: Not on file  Occupational History   Not on file  Tobacco Use   Smoking status: Former    Packs/day: 0.25    Years: 1.00    Additional pack years: 0.00    Total pack years: 0.25    Types: Cigarettes    Quit date: 2015    Years since quitting: 9.4   Smokeless tobacco:  Never  Vaping Use   Vaping Use: Never used  Substance and Sexual Activity   Alcohol use: Not Currently   Drug use: Never   Sexual activity: Not on file  Other Topics Concern   Not on file  Social History Narrative   Not on file   Social Determinants of Health   Financial Resource Strain: Not on file  Food Insecurity: Not on file  Transportation Needs: Not on file  Physical Activity: Not on file  Stress: Not on file  Social Connections: Not on file  Intimate Partner Violence: Not on file    No Known Allergies  Current Outpatient Medications  Medication Sig Dispense Refill   atenolol (TENORMIN) 50 MG tablet TAKE 1 TABLET(50 MG) BY MOUTH DAILY 30 tablet 3   No current facility-administered medications for this visit.     Family History  Problem Relation Age of Onset   Hypertension Mother    Heart attack Father    Diabetes Father    Diabetes Sister    Hypertension Sister    Diabetes Brother    Hypertension Brother        Physical Exam: Lungs:  clear Card: RR with 2/6 systolic and 2/6 diastolic murmur Ext: no edema Neuro intact     Diagnostic Studies & Laboratory data: I have personally reviewed the following studies and agree with the findings    Echocardiogram 07/02/2022: Normal LV systolic function with visual EF 60-65%. Left ventricle cavity is normal in size. Mild concentric hypertrophy of the left ventricle. Normal global wall motion. Doppler evidence of grade I (impaired) diastolic dysfunction, normal LAP. Calculated EF 66%. Left atrial cavity is moderately dilated at 45.1 ml/m^2. Bicuspid aortic valve. Moderate aortic stenosis. Moderate to severe aortic regurgitation. AVA (VTI) measures 1.8 cm^2. AV Mean Grad measures 23.7 mmHg. AV Pk Vel measures 3.56 m/s. Structurally normal mitral valve.  Mild (Grade I) mitral regurgitation. Structurally normal tricuspid valve with trace regurgitation. No evidence of pulmonary hypertension. Compared to 03/2021,  AS and AI have progressed slightly.  Addended by Clotilde Dieter, DO on 07/02/2022  3:09 PM    Recent Radiology Findings:       Recent Lab Findings: Lab Results  Component Value Date   WBC 7.0 06/27/2022   HGB 14.7 06/27/2022   HCT 46.0 06/27/2022   PLT 231 06/27/2022   GLUCOSE 105 (H) 06/27/2022   ALT 19 01/25/2018   AST 20 01/25/2018   NA 136 06/27/2022   K 5.1 06/27/2022   CL 102 06/27/2022   CREATININE 0.97 06/27/2022   BUN 19 06/27/2022   CO2 23 06/27/2022      Assessment / Plan:     42 yo male with NYHA class 0-1 symptoms of moderate to severe bicuspid AI and moderate AS with normal LV function and small aortic aneurysm. Agree with cardiology that at this point continued medical observation indicated and will have surgical intervention when needed. He is being seen in 6 months with repeat exam and echo.  We discussed the need for mechanical prosthesis if surgery in next 10 years and the need for coumadin anticoagulation. Pt understands. 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 Pt concerned about ability at this time to partake in physical activity (sex) and assured him that it was ok and to monitor his symptoms. Will follow up as needed by Cardiology   I have spent 60 min in review of the records, viewing studies and in face to face with patient and in coordination of future care    Eugenio Hoes 09/15/2022 11:33 AM

## 2022-09-16 ENCOUNTER — Institutional Professional Consult (permissible substitution) (INDEPENDENT_AMBULATORY_CARE_PROVIDER_SITE_OTHER): Payer: 59 | Admitting: Thoracic Surgery (Cardiothoracic Vascular Surgery)

## 2022-09-16 ENCOUNTER — Encounter: Payer: Self-pay | Admitting: Thoracic Surgery (Cardiothoracic Vascular Surgery)

## 2022-09-16 VITALS — BP 114/66 | HR 67 | Resp 20 | Ht 73.0 in | Wt 262.0 lb

## 2022-09-16 DIAGNOSIS — I351 Nonrheumatic aortic (valve) insufficiency: Secondary | ICD-10-CM | POA: Diagnosis not present

## 2022-09-16 DIAGNOSIS — I7121 Aneurysm of the ascending aorta, without rupture: Secondary | ICD-10-CM

## 2022-09-16 NOTE — Patient Instructions (Signed)
Follow up with Cardiology  Will recommend surgery when cardiology reffers back

## 2022-10-02 ENCOUNTER — Other Ambulatory Visit: Payer: 59

## 2022-10-10 ENCOUNTER — Ambulatory Visit: Payer: 59 | Admitting: Cardiology

## 2022-12-26 ENCOUNTER — Other Ambulatory Visit: Payer: Self-pay | Admitting: Cardiology

## 2023-01-02 ENCOUNTER — Other Ambulatory Visit: Payer: 59

## 2023-01-30 ENCOUNTER — Ambulatory Visit (HOSPITAL_COMMUNITY): Payer: 59 | Attending: Cardiology

## 2023-01-30 DIAGNOSIS — Q2381 Bicuspid aortic valve: Secondary | ICD-10-CM | POA: Insufficient documentation

## 2023-01-30 DIAGNOSIS — I7121 Aneurysm of the ascending aorta, without rupture: Secondary | ICD-10-CM | POA: Diagnosis present

## 2023-01-30 LAB — ECHOCARDIOGRAM COMPLETE
AR max vel: 1.93 cm2
AV Area VTI: 1.96 cm2
AV Area mean vel: 1.91 cm2
AV Mean grad: 29.5 mm[Hg]
AV Peak grad: 53.3 mm[Hg]
Ao pk vel: 3.65 m/s
Area-P 1/2: 2.41 cm2
P 1/2 time: 324 ms
S' Lateral: 2.9 cm

## 2023-02-06 ENCOUNTER — Encounter: Payer: Self-pay | Admitting: Cardiology

## 2023-02-06 ENCOUNTER — Ambulatory Visit: Payer: 59 | Attending: Cardiology | Admitting: Cardiology

## 2023-02-06 VITALS — BP 120/66 | HR 67 | Resp 16 | Ht 73.0 in | Wt 266.6 lb

## 2023-02-06 DIAGNOSIS — I7121 Aneurysm of the ascending aorta, without rupture: Secondary | ICD-10-CM | POA: Diagnosis not present

## 2023-02-06 DIAGNOSIS — I350 Nonrheumatic aortic (valve) stenosis: Secondary | ICD-10-CM

## 2023-02-06 DIAGNOSIS — Q2381 Bicuspid aortic valve: Secondary | ICD-10-CM | POA: Diagnosis not present

## 2023-02-06 DIAGNOSIS — I10 Essential (primary) hypertension: Secondary | ICD-10-CM | POA: Diagnosis not present

## 2023-02-06 DIAGNOSIS — I351 Nonrheumatic aortic (valve) insufficiency: Secondary | ICD-10-CM

## 2023-02-06 NOTE — Progress Notes (Signed)
Cardiology Office Note:  .   Date:  02/06/2023  ID:  Dustin [name], DOB [birth year], MRN [id number] PCP: Dustin Emery, MD  [facility] Providers Cardiologist:  Dustin Mainland, MD PCP: Dustin Emery, MD  Chief Complaint  Patient presents with   Bicuspid aortic valve with ascending aorta 4[name]0 to 4[name]5 cm in   Follow-up      History of Present Illness: Dustin [name] Kitchen    Dustin [name] is a 42 yVasquezo. male with bicuspid aortic stenosis/regurgitation, bicuspid aortopathy, anomalous pulmonary venous drainage  Patient is doing well, denies any complaints of chest pain, shortness of breath.  He has followed recommendations regarding avoiding weightlifting.  Reviewed recent echocardiogram results with the patient, details below.  Vitals:   02/06/23 1024  BP: 120/66  Pulse: 67  Resp: 16  SpO2: 96%     ROS:  Review of Systems  Cardiovascular:  Negative for chest pain, dyspnea on exertion, leg swelling, palpitations and syncope.     Studies Reviewed: .       Echocardiogram 01/30/2023: 1. Left ventricular ejection fraction, by estimation, is 60 to 65%. The  left ventricle has normal function. The left ventricle has no regional  wall motion abnormalities. There is mild left ventricular hypertrophy.  Left ventricular diastolic parameters  are consistent with Grade I diastolic dysfunction (impaired relaxation).   2. Right ventricular systolic function is normal. The right ventricular  size is normal.   3. Left atrial size was mildly dilated.   4. The mitral valve is grossly normal. Trivial mitral valve  regurgitation.   5. The aortic valve has an indeterminant number of cusps. There is  moderate calcification of the aortic valve. Aortic valve regurgitation is  severe. Mild to moderate aortic valve stenosis. Aortic regurgitation PHT  measures 324 msec. Aortic valve area,  by VTI measures 1[name]96 cm. Aortic valve mean gradient measures 29[name]5 mmHg.  Aortic valve Vmax measures 3[name]65  m/s.   6. Aortic dilatation noted. 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   7. The inferior vena cava is normal in size with greater than 50%  respiratory variability, suggesting right atrial pressure of 3 mmHg.   CTA aorta 07/18/2022: 1. Aneurysmal dilatation of the ascending aorta measuring 4[name]2 cm. Recommend annual imaging followup by CTA or MRA. This recommendation follows 2010 ACCF/AHA/AATS/ACR/ASA/SCA/SCAI/SIR/STS/SVM Guidelines for the Diagnosis and Management of Patients with Thoracic Aortic Disease. Circulation. 2010; 121: W098-J191. Aortic aneurysm NOS (ICD10-I71[name]9) 2. No evidence of dissection or pulmonary embolism. 3. Mild cardiomegaly.  Physical Exam:   Physical Exam Vitals and nursing note reviewed.  Constitutional:      General: He is not in acute distress. Neck:     Vascular: No JVD.  Cardiovascular:     Rate and Rhythm: Normal rate and regular rhythm.     Heart sounds: Murmur heard.     Harsh midsystolic murmur is present with a grade of 2/6 at the upper right sternal border radiating to the neck.     High-pitched blowing decrescendo early diastolic murmur is present with a grade of 2/4 at the upper right sternal border radiating to the apex.  Pulmonary:     Effort: Pulmonary effort is normal.     Breath sounds: Normal breath sounds. No wheezing or rales.  Musculoskeletal:     Right lower leg: No edema.     Left lower leg: No edema.  VISIT DIAGNOSES:   ICD-10-CM   1. Aneurysm of ascending aorta without rupture (HCC)  I71[name]21 CT ANGIO CHEST AORTA W/CM & OR WO/CM    2. Essential hypertension  I10 Basic metabolic panel    Basic metabolic panel    3. Bicuspid aortic valve  Q23[name]81     4. Nonrheumatic aortic valve insufficiency  I35[name]1     5. Nonrheumatic aortic valve stenosis  I35[name]0        ASSESSMENT AND PLAN: .    Dustin [name] is a 42 yVasquezo. male with  bicuspid aortic stenosis/regurgitation, bicuspid aortopathy, anomalous pulmonary venous drainage   Bicuspid aortopathy, AS, AI: Moderate AS, moderate AI. Ascending aorta measured 4[name]6 cm on echocardiogram 01/2023, as opposed to 4[name]2 cm on CTA aorta in 06/2022. I will repeat CTA aorta now for better comparison.  We will need to monitor closely if he truly has rapid growth in his aorta size, in which case he may need surgery sooner than later. If CTA aorta shows measurement more in line with prior measurement in 06/2022, we will continue annual CT imaging. Will repeat echocardiogram in 01/2024, will order at next visit. Continue atenolol for heart rate and blood pressure control. Avoid lifting heavy weights.   Mixed hyperlipidemia: Calcium score 0. LDL has been consistently >657. Discussed heart healthy diet recommendations.  Particularly, avoid eating beef and cheese on a regular basis. Will check lipid panel at next visit.     F/u in 6 months  Signed, Dustin Negus, MD

## 2023-02-06 NOTE — Patient Instructions (Addendum)
Medication Instructions:  Your physician recommends that you continue on your current medications as directed. Please refer to the Current Medication list given to you today.  *If you need a refill on your cardiac medications before your next appointment, please call your pharmacy*  Lab Work: BMP to be completed today  If you have labs (blood work) drawn today and your tests are completely normal, you will receive your results only by: MyChart Message (if you have MyChart) OR A paper copy in the mail If you have any lab test that is abnormal or we need to change your treatment, we will call you to review the results.  Testing/Procedures: Your physician has requested you have a CT Angiography of Aorta completed.  Follow-Up: At [facility], you and your health needs are our priority.  As part of our continuing mission to provide you with exceptional heart care, we have created designated Provider Care Teams.  These Care Teams include your primary Cardiologist (physician) and Advanced Practice Providers (APPs -  Physician Assistants and Nurse Practitioners) who all work together to provide you with the care you need, when you need it.  Your next appointment:   6 month(s)  The format for your next appointment:   In Person  Provider:   Elder Negus, MD {  Other Instructions Cardiac CT Angiography (CTA), is a special type of CT scan that uses a computer to produce multi-dimensional views of major blood vessels throughout the body. In CT angiography, a contrast material is injected through an IV to help visualize the blood vessels

## 2023-02-17 ENCOUNTER — Ambulatory Visit (HOSPITAL_COMMUNITY)
Admission: RE | Admit: 2023-02-17 | Discharge: 2023-02-17 | Disposition: A | Discharge status: Home / Self Care | Payer: 59 | Source: Ambulatory Visit | Attending: Cardiology | Admitting: Cardiology

## 2023-02-17 DIAGNOSIS — I7121 Aneurysm of the ascending aorta, without rupture: Secondary | ICD-10-CM | POA: Insufficient documentation

## 2023-02-17 MED ORDER — IOHEXOL 350 MG/ML SOLN
80.0000 mL | Freq: Once | INTRAVENOUS | Status: AC | PRN
  Administered 2023-02-17: 80 mL via INTRAVENOUS

## 2023-02-24 ENCOUNTER — Telehealth: Payer: Self-pay | Admitting: *Deleted

## 2023-02-24 DIAGNOSIS — Q2381 Bicuspid aortic valve: Secondary | ICD-10-CM

## 2023-02-24 DIAGNOSIS — I7121 Aneurysm of the ascending aorta, without rupture: Secondary | ICD-10-CM

## 2023-02-24 NOTE — Telephone Encounter (Signed)
-----   Message from [facility] sent at 02/24/2023 10:05 AM EST ----- Echocardiogram likely overestimated aorta size. It is fairly stable at 4-4.2 cm. We can safely monitor this by repeat CTA aorta in 01/2024.   Thanks MJP

## 2023-02-24 NOTE — Telephone Encounter (Signed)
The patient has been notified of the result and verbalized understanding.  All questions (if any) were answered.  Pt aware we will order for him to get a repeat CT ANGIO CHEST AORTA done in one year (around 01/2024) for surveillance.   Pt is aware that I will place the order for this in the system and have our [facility] Scheduling team reach out to him closer to that time frame, to arrange this study to be done in next (November 2025).   Future BMET placed in one year as well, and released in the system.  Pt verbalized understanding and agrees with this plan.

## 2023-02-24 NOTE — Progress Notes (Signed)
Echocardiogram likely overestimated aorta size. It is fairly stable at 4-4.2 cm. We can safely monitor this by repeat CTA aorta in 01/2024.   Thanks MJP

## 2023-04-27 ENCOUNTER — Other Ambulatory Visit: Payer: Self-pay | Admitting: Cardiology

## 2023-08-10 NOTE — Progress Notes (Signed)
 Cardiology Office Note:  .   Date:  08/10/2023  ID:  Jesusita Morris, DOB August 25, 1980, MRN [id number] PCP: Davida Espy, MD  [facility] Providers Cardiologist:  Fransico Ivy, MD PCP: Davida Espy, MD  Chief Complaint  Patient presents with   Aneurysm of ascending aorta without rupture   Follow-up    6 months      History of Present Illness: Aaron Aas    Takota Cahalan is a 43 y.o. male with bicuspid aortic stenosis/regurgitation, bicuspid aortopathy, anomalous pulmonary venous drainage  Patient has not had any recent chest pain, shortness of breath.  He reports recent dietary discretion on his trip to [place], but otherwise has been watching his diet to follow a heart healthy routine.  He has lost 20 pounds since his [place] trip.  Reviewed echocardiogram and CT scan results from 01/2024, details below.  Vitals:   08/11/23 1518  BP: 120/70  Pulse: 74  Resp: 16  SpO2: 96%      ROS:  Review of Systems  Cardiovascular:  Negative for chest pain, dyspnea on exertion, leg swelling, palpitations and syncope.     Studies Reviewed: Aaron Aas       EKG 08/11/2023: Normal sinus rhythm Left ventricular hypertrophy with repolarization abnormality ( R in aVL , Cornell product , Romhilt-Estes ) When compared with ECG of 13-Jan-2020 10:27, T wave inversion now evident in Lateral leads    Echocardiogram 01/30/2023: 1. Left ventricular ejection fraction, by estimation, is 60 to 65%. The  left ventricle has normal function. The left ventricle has no regional  wall motion abnormalities. There is mild left ventricular hypertrophy.  Left ventricular diastolic parameters  are consistent with Grade I diastolic dysfunction (impaired relaxation).   2. Right ventricular systolic function is normal. The right ventricular  size is normal.   3. Left atrial size was mildly dilated.   4. The mitral valve is grossly normal. Trivial mitral valve  regurgitation.   5. The aortic valve has an  indeterminant number of cusps. There is  moderate calcification of the aortic valve. Aortic valve regurgitation is  severe. Mild to moderate aortic valve stenosis. Aortic regurgitation PHT  measures 324 msec. Aortic valve area,  by VTI measures 1.96 cm. Aortic valve mean gradient measures 29.5 mmHg.  Aortic valve Vmax measures 3.65 m/s.   6. Aortic dilatation noted. 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   7. The inferior vena cava is normal in size with greater than 50%  respiratory variability, suggesting right atrial pressure of 3 mmHg.   CTA aorta 01/2023: Relatively unchanged size and configuration of the ascending aorta, estimated 4.0 cm   Physical Exam:   Physical Exam Vitals and nursing note reviewed.  Constitutional:      General: He is not in acute distress. Neck:     Vascular: No JVD.  Cardiovascular:     Rate and Rhythm: Normal rate and regular rhythm.     Heart sounds: Murmur heard.     Harsh midsystolic murmur is present with a grade of 2/6 at the upper right sternal border radiating to the neck.     High-pitched blowing decrescendo early diastolic murmur is present with a grade of 2/4 at the upper right sternal border radiating to the apex.  Pulmonary:     Effort: Pulmonary effort is normal.     Breath sounds: Normal breath sounds. No wheezing  or rales.  Musculoskeletal:     Right lower leg: No edema.     Left lower leg: No edema.     VISIT DIAGNOSES:   ICD-10-CM   1. Aneurysm of ascending aorta without rupture (HCC)  I71.21 EKG 12-Lead    2. Bicuspid aortic valve with ascending aorta 4.0 to 4.5 cm in diameter  Q23.81     3. Mixed hyperlipidemia  E78.2 Lipid panel    4. Nonrheumatic aortic valve insufficiency  I35.1 ECHOCARDIOGRAM COMPLETE    5. Essential hypertension  I10 Basic metabolic panel with GFR    6. Pre-procedure lab exam  Z01.812          ASSESSMENT AND PLAN: .    Cosby Proby is a 43 y.o. male with bicuspid aortic stenosis/regurgitation, bicuspid aortopathy, anomalous pulmonary venous drainage   Bicuspid aortopathy, AS, AI: Mild to moderate AAS, severe AI noted on echocardiogram in 01/2024. No associated symptoms. Repeat echocardiogram now, especially to evaluate LV dimensions. I do think he will require aortic valve replacement in the next few years if not sooner.  TAA: Ascending aorta measured 4.0 cm on CTA 01/2024. Will repeat echocardiogram and CTA in 01/2024. Continue atenolol  for heart rate and blood pressure control. Avoid lifting heavy weights.   Mixed hyperlipidemia: Calcium  score 0. LDL has been consistently >478. Discussed heart healthy diet recommendations.  Particularly, avoid eating beef and cheese on a regular basis. Will check lipid panel today.     F/u in 6 months  Signed, Cody Das, MD

## 2023-08-11 ENCOUNTER — Encounter: Payer: Self-pay | Admitting: Cardiology

## 2023-08-11 ENCOUNTER — Ambulatory Visit: Attending: Cardiology | Admitting: Cardiology

## 2023-08-11 ENCOUNTER — Other Ambulatory Visit: Payer: Self-pay

## 2023-08-11 VITALS — BP 120/70 | HR 74 | Resp 16 | Ht 73.0 in | Wt 239.8 lb

## 2023-08-11 DIAGNOSIS — I7121 Aneurysm of the ascending aorta, without rupture: Secondary | ICD-10-CM | POA: Diagnosis not present

## 2023-08-11 DIAGNOSIS — I10 Essential (primary) hypertension: Secondary | ICD-10-CM

## 2023-08-11 DIAGNOSIS — Q2381 Bicuspid aortic valve: Secondary | ICD-10-CM

## 2023-08-11 DIAGNOSIS — I351 Nonrheumatic aortic (valve) insufficiency: Secondary | ICD-10-CM | POA: Diagnosis not present

## 2023-08-11 DIAGNOSIS — E782 Mixed hyperlipidemia: Secondary | ICD-10-CM | POA: Diagnosis not present

## 2023-08-11 DIAGNOSIS — Z01812 Encounter for preprocedural laboratory examination: Secondary | ICD-10-CM | POA: Insufficient documentation

## 2023-08-11 NOTE — Patient Instructions (Addendum)
 Medication: The current medical regimen is effective;  continue present plan and medications.  Lab Work: Lipid panel  BMP in 6 months before CT in November.   If you have labs (blood work) drawn today and your tests are completely normal, you will receive your results only by: MyChart Message (if you have MyChart) OR A paper copy in the mail If you have any lab test that is abnormal or we need to change your treatment, we will call you to review the results.  Testing/Procedures: Echo  Your physician has requested that you have an echocardiogram. Echocardiography is a painless test that uses sound waves to create images of your heart. It provides your doctor with information about the size and shape of your heart and how well your heart's chambers and valves are working. This procedure takes approximately one hour. There are no restrictions for this procedure. Please do NOT wear cologne, perfume, aftershave, or lotions (deodorant is allowed). Please arrive 15 minutes prior to your appointment time.  Please note: We ask at that you not bring children with you during ultrasound (echo/ vascular) testing. Due to room size and safety concerns, children are not allowed in the ultrasound rooms during exams. Our front office staff cannot provide observation of children in our lobby area while testing is being conducted. An adult accompanying a patient to their appointment will only be allowed in the ultrasound room at the discretion of the ultrasound technician under special circumstances. We apologize for any inconvenience.   CTA in 01/2024  Your physician has requested that you have CT Angio of chest in November. Cardiac computed tomography (CT) is a painless test that uses an x-ray machine to take clear, detailed pictures of your heart. For further information please visit [email or web address]  You will be contacted to be scheduled for this testing.    Follow-Up: At [facility], you  and your health needs are our priority.  As part of our continuing mission to provide you with exceptional heart care, our providers are all part of one team.  This team includes your primary Cardiologist (physician) and Advanced Practice Providers or APPs (Physician Assistants and Nurse Practitioners) who all work together to provide you with the care you need, when you need it.  Your next appointment:   01/2024  Provider:   Cody Das, MD

## 2023-08-15 ENCOUNTER — Telehealth (HOSPITAL_COMMUNITY): Payer: Self-pay | Admitting: Cardiology

## 2023-08-15 NOTE — Telephone Encounter (Signed)
 Noted.  Thanks MJP

## 2023-08-15 NOTE — Telephone Encounter (Signed)
 I called to schedule ordered Echocardiogram per Dr. Filiberto Hug. Patient states that he is out of the [place] and will callback to reschedule when he gets back. Order will be removed from the echo Wq and when patient calls back we will reinstate the order. Thank you.

## 2023-10-03 ENCOUNTER — Encounter: Payer: Self-pay | Admitting: Cardiology

## 2024-01-27 ENCOUNTER — Ambulatory Visit (HOSPITAL_BASED_OUTPATIENT_CLINIC_OR_DEPARTMENT_OTHER)
Admission: RE | Admit: 2024-01-27 | Discharge: 2024-01-27 | Disposition: A | Discharge status: Home / Self Care | Source: Ambulatory Visit | Attending: Cardiology | Admitting: Cardiology

## 2024-01-27 DIAGNOSIS — I7121 Aneurysm of the ascending aorta, without rupture: Secondary | ICD-10-CM | POA: Diagnosis present

## 2024-01-27 DIAGNOSIS — Q2381 Bicuspid aortic valve: Secondary | ICD-10-CM | POA: Diagnosis present

## 2024-01-27 MED ORDER — IOHEXOL 350 MG/ML SOLN
100.0000 mL | Freq: Once | INTRAVENOUS | Status: AC | PRN
  Administered 2024-01-27: 75 mL via INTRAVENOUS

## 2024-02-02 ENCOUNTER — Ambulatory Visit: Payer: Self-pay | Admitting: Cardiology

## 2024-02-02 ENCOUNTER — Telehealth: Payer: Self-pay | Admitting: Cardiology

## 2024-02-02 NOTE — Telephone Encounter (Signed)
 STAT if patient feels like he/she is going to faint    1. Are you feeling dizzy, lightheaded, or faint right now? No       2. Have you passed out?  No  (If yes move to .SYNCOPECHMG)     3. Do you have any other symptoms? Dizziness, sweating, presyncope, headache, fatigue      4. Have you checked your HR and BP (record if available)? 190/95 yesterday, 125/81 today    Pt has had dizziness spells  past Saturday and Sunday. Pt was changing a sink faucet when the spell started. PT states he was not exerting himself. His bp is normal today but he is very worried. Please advise.    I spoke with the patient and he stated that his blood pressure over the weekend was elevated but he is doing better now. His blood pressures run in the 150s/80s in the morning but towards the evenings he is back to 120s-130s/80s. He is concerned about the dizziness he felt over the weekend and wants to know what he can do. He has an appointment with us  in 3 weeks. I recommended that he stay hydrated, go on walks daily, avoid salty foods and to continue taking his medication. I gave ED precautions if he experiences any changes or any other symptoms. I will forward this to Dr. Elmira for any further advise.

## 2024-02-02 NOTE — Telephone Encounter (Signed)
 Stable aorta size.  Reviewed recent call regarding dizziness symptoms.  If symptoms have resolved, blood pressure is back to normal I do not see any acute concern.  Okay to keep appointment on 02/23/2024.  Thanks MJP

## 2024-02-02 NOTE — Telephone Encounter (Signed)
 Dr. Elmira gave his reply to my previous telephone encounter with this patient. I gave his response to the patient by calling them and the patient was receptive. We will see him for his upcoming appointment on 02/23/24.

## 2024-02-02 NOTE — Progress Notes (Signed)
 Stable aorta size.  Reviewed recent call regarding dizziness symptoms.  If symptoms have resolved, blood pressure is back to normal I do not see any acute concern.  Okay to keep appointment on 02/23/2024.  Thanks MJP

## 2024-02-02 NOTE — Telephone Encounter (Signed)
 STAT if patient feels like he/she is going to faint   1. Are you feeling dizzy, lightheaded, or faint right now? No     2. Have you passed out?  No  (If yes move to .SYNCOPECHMG)   3. Do you have any other symptoms? Dizziness, sweating, presyncope, headache, fatigue    4. Have you checked your HR and BP (record if available)? 190/95 yesterday, 125/81 today   Pt has had dizziness spells  past Saturday and Sunday. Pt was changing a sink faucet when the spell started. PT states he was not exerting himself. His bp is normal today but he is very worried. Please advise.   [phone]

## 2024-02-23 ENCOUNTER — Encounter: Payer: Self-pay | Admitting: Cardiology

## 2024-02-23 ENCOUNTER — Ambulatory Visit: Attending: Cardiology | Admitting: Cardiology

## 2024-02-23 VITALS — BP 120/54 | HR 83 | Ht 72.0 in | Wt 267.0 lb

## 2024-02-23 DIAGNOSIS — I351 Nonrheumatic aortic (valve) insufficiency: Secondary | ICD-10-CM

## 2024-02-23 DIAGNOSIS — M79605 Pain in left leg: Secondary | ICD-10-CM | POA: Insufficient documentation

## 2024-02-23 DIAGNOSIS — I7121 Aneurysm of the ascending aorta, without rupture: Secondary | ICD-10-CM

## 2024-02-23 LAB — CBC
Hematocrit: 46.7 % (ref 37.5–51.0)
Hemoglobin: 14.9 g/dL (ref 13.0–17.7)
MCH: 25.5 pg — ABNORMAL LOW (ref 26.6–33.0)
MCHC: 31.9 g/dL (ref 31.5–35.7)
MCV: 80 fL (ref 79–97)
Platelets: 229 x10E3/uL (ref 150–450)
RBC: 5.85 x10E6/uL — ABNORMAL HIGH (ref 4.14–5.80)
RDW: 13.3 % (ref 11.6–15.4)
WBC: 8 x10E3/uL (ref 3.4–10.8)

## 2024-02-23 LAB — BASIC METABOLIC PANEL WITH GFR
BUN/Creatinine Ratio: 18 (ref 9–20)
BUN: 15 mg/dL (ref 6–24)
CO2: 22 mmol/L (ref 20–29)
Calcium: 9.5 mg/dL (ref 8.7–10.2)
Chloride: 103 mmol/L (ref 96–106)
Creatinine, Ser: 0.85 mg/dL (ref 0.76–1.27)
Glucose: 98 mg/dL (ref 70–99)
Potassium: 4.5 mmol/L (ref 3.5–5.2)
Sodium: 138 mmol/L (ref 134–144)
eGFR: 110 mL/min/1.73 (ref >59)

## 2024-02-23 NOTE — Patient Instructions (Addendum)
 Lab Work: CBC BMP  If you have labs (blood work) drawn today and your tests are completely normal, you will receive your results only by: MyChart Message (if you have MyChart) OR A paper copy in the mail If you have any lab test that is abnormal or we need to change your treatment, we will call you to review the results.  Testing/Procedures: TEE and Left/Right Heart Cath   Your physician has requested that you have a TEE/Cardioversion. During a TEE, sound waves are used to create images of your heart. It provides your doctor with information about the size and shape of your heart and how well your heart's chambers and valves are working. In this test, a transducer is attached to the end of a flexible tube that is guided down you throat and into your esophagus (the tube leading from your mouth to your stomach) to get a more detailed image of your heart. Once the TEE has determined that a blood clot is not present, the cardioversion begins. Electrical Cardioversion uses a jolt of electricity to your heart either through paddles or wired patches attached to your chest. This is a controlled, usually prescheduled, procedure. This procedure is done at the hospital and you are not awake during the procedure. You usually go home the day of the procedure. Please see the instruction sheet given to you today for more information.   Your physician has requested that you have a cardiac catheterization. Cardiac catheterization is used to diagnose and/or treat various heart conditions. Doctors may recommend this procedure for a number of different reasons. The most common reason is to evaluate chest pain. Chest pain can be a symptom of coronary artery disease (CAD), and cardiac catheterization can show whether plaque is narrowing or blocking your heart's arteries. This procedure is also used to evaluate the valves, as well as measure the blood flow and oxygen levels in different parts of your heart. For further  information please visit [email or web address] Please follow instruction sheet, as given.   Lower extremity venous Your physician has requested that you have a lower or upper extremity venous duplex. This test is an ultrasound of the veins in the legs or arms. It looks at venous blood flow that carries blood from the heart to the legs or arms. Allow one hour for a Lower Venous exam. Allow thirty minutes for an Upper Venous exam. There are no restrictions or special instructions.  Please note: We ask at that you not bring children with you during ultrasound (echo/ vascular) testing. Due to room size and safety concerns, children are not allowed in the ultrasound rooms during exams. Our front office staff cannot provide observation of children in our lobby area while testing is being conducted. An adult accompanying a patient to their appointment will only be allowed in the ultrasound room at the discretion of the ultrasound technician under special circumstances. We apologize for any inconvenience.   Referral to cardiothoracic surgery   Follow-Up: At [facility], you and your health needs are our priority.  As part of our continuing mission to provide you with exceptional heart care, our providers are all part of one team.  This team includes your primary Cardiologist (physician) and Advanced Practice Providers or APPs (Physician Assistants and Nurse Practitioners) who all work together to provide you with the care you need, when you need it.  Your next appointment:   2 week(s) POST TEE/ CATH   3 month with Dr. Elmira   Provider:  One of our Advanced Practice Providers (APPs): Morse Clause, PA-C  Lamarr Satterfield, NP Miriam Shams, NP  Olivia Pavy, PA-C Josefa Beauvais, NP  Leontine Salen, PA-C Orren Fabry, PA-C  [place], PA-C Ernest Dick, NP  Damien Braver, NP Jon Hails, PA-C  Waddell Donath, PA-C    Dayna Dunn, PA-C  Scott Weaver, PA-C Lum Louis, NP Katlyn West,  NP Callie Goodrich, PA-C  Xika Zhao, NP Sheng Haley, PA-C    Kathleen Johnson, PA-C   Other Instructions     Dear Dyana Base  You are scheduled for a TEE (Transesophageal Echocardiogram) on Friday, December 5 with Dr. Kriste.  Please arrive at the [facility] (Main Entrance A) at [facility]: [address] [place], [place] [address] at 10:00 AM (This time is 2 hour(s) before your procedure to ensure your preparation).   Free valet parking service is available. You will check in at ADMITTING.   *Please Note: You will receive a call the day before your procedure to confirm the appointment time. That time may have changed from the original time based on the schedule for that day.*    DIET:  Nothing to eat or drink after midnight except a sip of water with medications (see medication instructions below)  MEDICATION INSTRUCTIONS: !!IF ANY NEW MEDICATIONS ARE STARTED AFTER TODAY, PLEASE NOTIFY YOUR PROVIDER AS SOON AS POSSIBLE!!  FYI: Medications such as Semaglutide (Ozempic, Wegovy), Tirzepatide (Mounjaro, Zepbound), Dulaglutide (Trulicity), etc (GLP1 agonists) AND Canagliflozin (Invokana), Dapagliflozin (Farxiga), Empagliflozin (Jardiance), Ertugliflozin (Steglatro), Bexagliflozin [employer]) or any combination with one of these drugs such as Invokamet (Canagliflozin/Metformin), Synjardy (Empagliflozin/Metformin), etc (SGLT2 inhibitors) must be held around the time of a procedure. This is not a comprehensive list of all of these drugs. Please review all of your medications and talk to your provider if you take any one of these. If you are not sure, ask your provider.   LABS:   Come to the lab at the [facility] ([address], [place], 1st Floor) between the hours of 8:00 am and 4:30 pm. You do NOT have to be fasting.  FYI:  For your safety, and to allow us  to monitor your vital signs accurately during the surgery/procedure we  request: If you have artificial nails, gel coating, SNS etc, please have those removed prior to your surgery/procedure. Not having the nail coverings /polish removed may result in cancellation or delay of your surgery/procedure.  Your support person will be asked to wait in the waiting room during your procedure.  It is OK to have someone drop you off and come back when you are ready to be discharged.  You cannot drive after the procedure and will need someone to drive you home.  Bring your insurance cards.  *Special Note: Every effort is made to have your procedure done on time. Occasionally there are emergencies that occur at the hospital that may cause delays. Please be patient if a delay does occur.     [facility] A DEPT OF [facility] AT [address] A DEPT OF THE . [facility] [address] [place] [address] Dept: [phone] Loc: [phone]  Karin Griffith  02/23/2024  You are scheduled for a Cardiac Catheterization on Friday, December 5 with Dr. Newman Lawrence.  1. Please arrive at the [facility] (Main Entrance A) at [facility]: [address] [place], [place] [address] at 10:00 AM for  TEE.  Free valet parking service is available. You will check in at ADMITTING. The support person will be asked to wait in the waiting room.  It is OK to have someone drop you off and come back when you are ready to be discharged.    Special note: Every effort is made to have your procedure done on time. Please understand that emergencies sometimes delay scheduled procedures.  2. Diet: Nothing to eat after midnight.   3. Hydration: You need to be well hydrated before your procedure. Nothing to drink after midnight.   List of approved liquids water , clear juice, clear tea, black coffee, fruit juices, non-citric and without pulp, carbonated beverages, Gatorade, Kool -Aid, plain Jello-O and plain ice popsicles.  4.  Labs:TODAY  5. Medication instructions in preparation for your procedure:   Contrast Allergy: No   On the morning of your procedure, take your Aspirin  81 mg and any morning medicines NOT listed above.  You may use sips of water .  6. Plan to go home the same day, you will only stay overnight if medically necessary. 7. Bring a current list of your medications and current insurance cards. 8. You MUST have a responsible person to drive you home. 9. Someone MUST be with you the first 24 hours after you arrive home or your discharge will be delayed. 10. Please wear clothes that are easy to get on and off and wear slip-on shoes.  Thank you for allowing us  to care for you!   -- [facility]

## 2024-02-23 NOTE — Progress Notes (Signed)
 Cardiology Office Note:  .   Date:  02/23/2024  ID:  Dustin [name], DOB 10/12/1980, MRN [id number] PCP: Sim Emery CROME, MD  [facility] Providers Cardiologist:  Newman Lawrence, MD PCP: Sim Emery CROME, MD  Chief Complaint  Patient presents with   Aortic regurgitation      History of Present Illness: Dustin    Dustin [name] is a 43 y.o. male with bicuspid aortic stenosis/regurgitation, bicuspid aortopathy, anomalous pulmonary venous drainage  Few weeks ago, patient had an episode of diaphoresis and hypotension while trying to fix a leaking faucet in his kitchen.  Blood pressure was elevated up to 170 mmHg for a few days.  Since then, it has normalized without any recurrence of similar symptoms.  However, on further questioning, he does report that he gets out of breath and tired easily with minimal physical activity.  Separately, he also has noticed pain in his left calf, that seems to bother him more at night.  Vitals:   02/23/24 0804  BP: (!) 120/54  Pulse: 83  SpO2: 97%      ROS:  Review of Systems  Cardiovascular:  Positive for dyspnea on exertion. Negative for chest pain, leg swelling, palpitations and syncope.     Studies Reviewed: Dustin       EKG 02/23/2024: Normal sinus rhythm Left ventricular hypertrophy with repolarization abnormality ( R in aVL , Cornell product , Romhilt-Estes ) When compared with ECG of 11-Aug-2023 15:21, No significant change was found   CTA 01/2024: 1. Ascending aortic aneurysm measuring 4 cm, unchanged from prior study on 02/17/2023. No evidence of dissection. 2. Mild patchy ground-glass opacities throughout both lower lobes. Correlate clinically for possible infectious or inflammatory etiology.     Echocardiogram 01/30/2023: 1. Left ventricular ejection fraction, by estimation, is 60 to 65%. The  left ventricle has normal function. The left ventricle has no regional  wall motion abnormalities. There is mild left ventricular  hypertrophy.  Left ventricular diastolic parameters  are consistent with Grade I diastolic dysfunction (impaired relaxation).   2. Right ventricular systolic function is normal. The right ventricular  size is normal.   3. Left atrial size was mildly dilated.   4. The mitral valve is grossly normal. Trivial mitral valve  regurgitation.   5. The aortic valve has an indeterminant number of cusps. There is  moderate calcification of the aortic valve. Aortic valve regurgitation is  severe. Mild to moderate aortic valve stenosis. Aortic regurgitation PHT  measures 324 msec. Aortic valve area,  by VTI measures 1.96 cm. Aortic valve mean gradient measures 29.5 mmHg.  Aortic valve Vmax measures 3.65 m/s.   6. Aortic dilatation noted. 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   7. The inferior vena cava is normal in size with greater than 50%  respiratory variability, suggesting right atrial pressure of 3 mmHg.   CTA aorta 01/2023: Relatively unchanged size and configuration of the ascending aorta, estimated 4.0 cm   Physical Exam:   Physical Exam Vitals and nursing note reviewed.  Constitutional:      General: He is not in acute distress. Neck:     Vascular: No JVD.  Cardiovascular:     Rate and Rhythm: Normal rate and regular rhythm.     Heart sounds: Murmur heard.     Harsh midsystolic murmur is present with a grade of 3/6 at the upper right sternal  border radiating to the neck.     High-pitched blowing decrescendo early diastolic murmur is present with a grade of 2/4 at the upper right sternal border radiating to the apex.  Pulmonary:     Effort: Pulmonary effort is normal.     Breath sounds: Normal breath sounds. No wheezing or rales.  Musculoskeletal:     Right lower leg: No edema.     Left lower leg: No edema.      VISIT DIAGNOSES:   ICD-10-CM   1. Pain of left lower extremity   M79.605 VAS US  LOWER EXTREMITY ARTERIAL DUPLEX    VAS US  ABI WITH/WO TBI    VAS US  LOWER EXTREMITY VENOUS (DVT)    2. Severe aortic insufficiency  I35.1 CBC    Basic metabolic panel with GFR    Ambulatory referral to Cardiothoracic Surgery    EKG 12-Lead    3. Aneurysm of ascending aorta without rupture  I71.21          ASSESSMENT AND PLAN: .    Dustin [name] is a 42 y.o. male with bicuspid aortic stenosis/regurgitation, bicuspid aortopathy, anomalous pulmonary venous drainage   Bicuspid aortopathy, AS, AI: Mild to moderate AAS, severe AI noted on echocardiogram in 01/2023 He now has exertional dyspnea symptoms with less than usual activity. Recommend proceeding with TEE and left and right heart catheterization. Referred to cardiothoracic surgery for consideration for aortic valve replacement.  TAA: Ascending aorta measured 4.0 cm on CTA 01/2024. While atenolol  would be generally avoided in severe aortic regurgitation, we have kept him on low-dose atenolol  given his ascending aorta aneurysm with which heart rate and blood pressure are fairly well-controlled. Avoid lifting heavy weights.   Left leg pain: No obvious abnormality.  Will check ultrasound to rule out DVT.  Mixed hyperlipidemia: Calcium  score 0. Recommend diet and lifestyle modifications.   Informed Consent   Shared Decision Making/Informed Consent The risks [stroke (1 in 1000), death (1 in 1000), kidney failure [usually temporary] (1 in 500), bleeding (1 in 200), allergic reaction [possibly serious] (1 in 200)], benefits (diagnostic support and management of coronary artery disease) and alternatives of a cardiac catheterization were discussed in detail with Dustin [name] and he is willing to proceed.     The risks [esophageal damage, perforation (1:10,000 risk), bleeding, pharyngeal hematoma as well as other potential complications associated with conscious sedation including aspiration, arrhythmia, respiratory  failure and death], benefits (treatment guidance and diagnostic support) and alternatives of a transesophageal echocardiogram were discussed in detail with Dustin [name] and he is willing to proceed.         F/u in 3 months  Signed, Newman JINNY Lawrence, MD

## 2024-02-23 NOTE — H&P (View-Only) (Signed)
 Cardiology Office Note:  .   Date:  02/23/2024  ID:  Dustin [name], DOB 10/12/1980, MRN [id number] PCP: Sim Emery CROME, MD  [facility] Providers Cardiologist:  Newman Lawrence, MD PCP: Sim Emery CROME, MD  Chief Complaint  Patient presents with   Aortic regurgitation      History of Present Illness: Dustin    Dustin [name] is a 43 y.o. male with bicuspid aortic stenosis/regurgitation, bicuspid aortopathy, anomalous pulmonary venous drainage  Few weeks ago, patient had an episode of diaphoresis and hypotension while trying to fix a leaking faucet in his kitchen.  Blood pressure was elevated up to 170 mmHg for a few days.  Since then, it has normalized without any recurrence of similar symptoms.  However, on further questioning, he does report that he gets out of breath and tired easily with minimal physical activity.  Separately, he also has noticed pain in his left calf, that seems to bother him more at night.  Vitals:   02/23/24 0804  BP: (!) 120/54  Pulse: 83  SpO2: 97%      ROS:  Review of Systems  Cardiovascular:  Positive for dyspnea on exertion. Negative for chest pain, leg swelling, palpitations and syncope.     Studies Reviewed: Dustin       EKG 02/23/2024: Normal sinus rhythm Left ventricular hypertrophy with repolarization abnormality ( R in aVL , Cornell product , Romhilt-Estes ) When compared with ECG of 11-Aug-2023 15:21, No significant change was found   CTA 01/2024: 1. Ascending aortic aneurysm measuring 4 cm, unchanged from prior study on 02/17/2023. No evidence of dissection. 2. Mild patchy ground-glass opacities throughout both lower lobes. Correlate clinically for possible infectious or inflammatory etiology.     Echocardiogram 01/30/2023: 1. Left ventricular ejection fraction, by estimation, is 60 to 65%. The  left ventricle has normal function. The left ventricle has no regional  wall motion abnormalities. There is mild left ventricular  hypertrophy.  Left ventricular diastolic parameters  are consistent with Grade I diastolic dysfunction (impaired relaxation).   2. Right ventricular systolic function is normal. The right ventricular  size is normal.   3. Left atrial size was mildly dilated.   4. The mitral valve is grossly normal. Trivial mitral valve  regurgitation.   5. The aortic valve has an indeterminant number of cusps. There is  moderate calcification of the aortic valve. Aortic valve regurgitation is  severe. Mild to moderate aortic valve stenosis. Aortic regurgitation PHT  measures 324 msec. Aortic valve area,  by VTI measures 1.96 cm. Aortic valve mean gradient measures 29.5 mmHg.  Aortic valve Vmax measures 3.65 m/s.   6. Aortic dilatation noted. 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   7. The inferior vena cava is normal in size with greater than 50%  respiratory variability, suggesting right atrial pressure of 3 mmHg.   CTA aorta 01/2023: Relatively unchanged size and configuration of the ascending aorta, estimated 4.0 cm   Physical Exam:   Physical Exam Vitals and nursing note reviewed.  Constitutional:      General: He is not in acute distress. Neck:     Vascular: No JVD.  Cardiovascular:     Rate and Rhythm: Normal rate and regular rhythm.     Heart sounds: Murmur heard.     Harsh midsystolic murmur is present with a grade of 3/6 at the upper right sternal  border radiating to the neck.     High-pitched blowing decrescendo early diastolic murmur is present with a grade of 2/4 at the upper right sternal border radiating to the apex.  Pulmonary:     Effort: Pulmonary effort is normal.     Breath sounds: Normal breath sounds. No wheezing or rales.  Musculoskeletal:     Right lower leg: No edema.     Left lower leg: No edema.      VISIT DIAGNOSES:   ICD-10-CM   1. Pain of left lower extremity   M79.605 VAS US  LOWER EXTREMITY ARTERIAL DUPLEX    VAS US  ABI WITH/WO TBI    VAS US  LOWER EXTREMITY VENOUS (DVT)    2. Severe aortic insufficiency  I35.1 CBC    Basic metabolic panel with GFR    Ambulatory referral to Cardiothoracic Surgery    EKG 12-Lead    3. Aneurysm of ascending aorta without rupture  I71.21          ASSESSMENT AND PLAN: .    Dustin [name] is a 42 y.o. male with bicuspid aortic stenosis/regurgitation, bicuspid aortopathy, anomalous pulmonary venous drainage   Bicuspid aortopathy, AS, AI: Mild to moderate AAS, severe AI noted on echocardiogram in 01/2023 He now has exertional dyspnea symptoms with less than usual activity. Recommend proceeding with TEE and left and right heart catheterization. Referred to cardiothoracic surgery for consideration for aortic valve replacement.  TAA: Ascending aorta measured 4.0 cm on CTA 01/2024. While atenolol  would be generally avoided in severe aortic regurgitation, we have kept him on low-dose atenolol  given his ascending aorta aneurysm with which heart rate and blood pressure are fairly well-controlled. Avoid lifting heavy weights.   Left leg pain: No obvious abnormality.  Will check ultrasound to rule out DVT.  Mixed hyperlipidemia: Calcium  score 0. Recommend diet and lifestyle modifications.   Informed Consent   Shared Decision Making/Informed Consent The risks [stroke (1 in 1000), death (1 in 1000), kidney failure [usually temporary] (1 in 500), bleeding (1 in 200), allergic reaction [possibly serious] (1 in 200)], benefits (diagnostic support and management of coronary artery disease) and alternatives of a cardiac catheterization were discussed in detail with Dustin [name] and he is willing to proceed.     The risks [esophageal damage, perforation (1:10,000 risk), bleeding, pharyngeal hematoma as well as other potential complications associated with conscious sedation including aspiration, arrhythmia, respiratory  failure and death], benefits (treatment guidance and diagnostic support) and alternatives of a transesophageal echocardiogram were discussed in detail with Dustin [name] and he is willing to proceed.         F/u in 3 months  Signed, Newman JINNY Lawrence, MD

## 2024-02-24 ENCOUNTER — Ambulatory Visit: Payer: Self-pay | Admitting: Cardiology

## 2024-02-24 ENCOUNTER — Ambulatory Visit (HOSPITAL_COMMUNITY): Admission: RE | Admit: 2024-02-24 | Discharge: 2024-02-24 | Attending: Cardiology

## 2024-02-24 DIAGNOSIS — M79605 Pain in left leg: Secondary | ICD-10-CM | POA: Insufficient documentation

## 2024-02-24 NOTE — Progress Notes (Signed)
 No DVT noted.  Thanks MJP

## 2024-02-25 ENCOUNTER — Telehealth: Payer: Self-pay | Admitting: *Deleted

## 2024-02-25 NOTE — Telephone Encounter (Signed)
 Cardiac Catheterization scheduled at [facility] for: Friday February 27, 2024 1:30 PM/TEE 12 Noon Arrival time [facility] Main Entrance A at: 11 AM  Diet: -Nothing to eat or drink after midnight.  Medication instructions: -AM of procedure take aspirin  81 mg with sips of water.  Plan to go home the same day, you will only stay overnight if medically necessary.  You must have responsible adult to drive you home.  Someone must be with you the first 24 hours after you arrive home.  Reviewed procedure instructions with patient.

## 2024-02-26 NOTE — Progress Notes (Signed)
 MyChart message containing providers result note and interpretation read by patient: Last read by Dyana Barabara Shed at 1:29PM on 02/26/2024.

## 2024-02-26 NOTE — Progress Notes (Signed)
 Pt called for pre procedure instructions. Arrival time 1000 NPO after midnight explained Instructed to take am meds with sip of water. Instructed pt need for ride home tomorrow and have responsible adult with them for 24 hrs post procedure.

## 2024-02-27 ENCOUNTER — Ambulatory Visit (HOSPITAL_COMMUNITY)
Admission: RE | Admit: 2024-02-27 | Discharge: 2024-02-27 | Disposition: A | Attending: Internal Medicine | Admitting: Internal Medicine

## 2024-02-27 ENCOUNTER — Ambulatory Visit (HOSPITAL_COMMUNITY)
Admission: RE | Admit: 2024-02-27 | Discharge: 2024-02-27 | Disposition: A | Source: Home / Self Care | Attending: Internal Medicine | Admitting: Internal Medicine

## 2024-02-27 ENCOUNTER — Other Ambulatory Visit: Payer: Self-pay

## 2024-02-27 ENCOUNTER — Ambulatory Visit (HOSPITAL_COMMUNITY): Admitting: Anesthesiology

## 2024-02-27 ENCOUNTER — Encounter (HOSPITAL_COMMUNITY): Admission: RE | Disposition: A | Payer: Self-pay | Attending: Internal Medicine

## 2024-02-27 DIAGNOSIS — I340 Nonrheumatic mitral (valve) insufficiency: Secondary | ICD-10-CM | POA: Diagnosis not present

## 2024-02-27 DIAGNOSIS — I351 Nonrheumatic aortic (valve) insufficiency: Secondary | ICD-10-CM

## 2024-02-27 HISTORY — PX: RIGHT/LEFT HEART CATH AND CORONARY ANGIOGRAPHY: CATH118266

## 2024-02-27 HISTORY — PX: TRANSESOPHAGEAL ECHOCARDIOGRAM (CATH LAB): EP1270

## 2024-02-27 LAB — POCT I-STAT 7, (LYTES, BLD GAS, ICA,H+H)
Acid-base deficit: 3 mmol/L — ABNORMAL HIGH (ref 0.0–2.0)
Bicarbonate: 22.6 mmol/L (ref 20.0–28.0)
Calcium, Ion: 1.17 mmol/L (ref 1.15–1.40)
HCT: 39 % (ref 39.0–52.0)
Hemoglobin: 13.3 g/dL (ref 13.0–17.0)
O2 Saturation: 97 %
Potassium: 4.2 mmol/L (ref 3.5–5.1)
Sodium: 141 mmol/L (ref 135–145)
TCO2: 24 mmol/L (ref 22–32)
pCO2 arterial: 40.8 mmHg (ref 32–48)
pH, Arterial: 7.352 (ref 7.35–7.45)
pO2, Arterial: 90 mmHg (ref 83–108)

## 2024-02-27 LAB — POCT I-STAT EG7
Acid-base deficit: 2 mmol/L (ref 0.0–2.0)
Bicarbonate: 23.9 mmol/L (ref 20.0–28.0)
Calcium, Ion: 1.2 mmol/L (ref 1.15–1.40)
HCT: 39 % (ref 39.0–52.0)
Hemoglobin: 13.3 g/dL (ref 13.0–17.0)
O2 Saturation: 69 %
Potassium: 4.3 mmol/L (ref 3.5–5.1)
Sodium: 141 mmol/L (ref 135–145)
TCO2: 25 mmol/L (ref 22–32)
pCO2, Ven: 45.8 mmHg (ref 44–60)
pH, Ven: 7.326 (ref 7.25–7.43)
pO2, Ven: 39 mmHg (ref 32–45)

## 2024-02-27 LAB — ECHO TEE
AV Mean grad: 36 mmHg
AV Peak grad: 64.3 mmHg
Ao pk vel: 4.01 m/s

## 2024-02-27 SURGERY — RIGHT/LEFT HEART CATH AND CORONARY ANGIOGRAPHY
Anesthesia: LOCAL

## 2024-02-27 SURGERY — TRANSESOPHAGEAL ECHOCARDIOGRAM (TEE) (CATHLAB)
Anesthesia: Monitor Anesthesia Care

## 2024-02-27 MED ORDER — LIDOCAINE HCL (PF) 1 % IJ SOLN
INTRAMUSCULAR | Status: DC | PRN
  Administered 2024-02-27: 5 mL

## 2024-02-27 MED ORDER — SODIUM CHLORIDE 0.9% FLUSH: 3.0000 mL | INTRAVENOUS | Status: DC | PRN

## 2024-02-27 MED ORDER — MIDAZOLAM HCL 2 MG/2ML IJ SOLN
INTRAMUSCULAR | Status: AC
  Filled 2024-02-27: qty 2

## 2024-02-27 MED ORDER — FENTANYL CITRATE (PF) 100 MCG/2ML IJ SOLN
INTRAMUSCULAR | Status: DC | PRN
  Administered 2024-02-27: 25 ug via INTRAVENOUS

## 2024-02-27 MED ORDER — SODIUM CHLORIDE 0.9 % IV SOLN: 250.0000 mL | INTRAVENOUS | Status: DC | PRN

## 2024-02-27 MED ORDER — PROPOFOL 500 MG/50ML IV EMUL
INTRAVENOUS | Status: DC | PRN
  Administered 2024-02-27: 150 ug/kg/min via INTRAVENOUS

## 2024-02-27 MED ORDER — PROPOFOL 10 MG/ML IV BOLUS
INTRAVENOUS | Status: DC | PRN
  Administered 2024-02-27 (×3): 50 mg via INTRAVENOUS

## 2024-02-27 MED ORDER — VERAPAMIL HCL 2.5 MG/ML IV SOLN
INTRAVENOUS | Status: DC | PRN
  Administered 2024-02-27: 10 mL via INTRA_ARTERIAL

## 2024-02-27 MED ORDER — FENTANYL CITRATE (PF) 100 MCG/2ML IJ SOLN
INTRAMUSCULAR | Status: AC
  Filled 2024-02-27: qty 2

## 2024-02-27 MED ORDER — LIDOCAINE HCL (PF) 1 % IJ SOLN
INTRAMUSCULAR | Status: AC
  Filled 2024-02-27: qty 30

## 2024-02-27 MED ORDER — VERAPAMIL HCL 2.5 MG/ML IV SOLN
INTRAVENOUS | Status: AC
  Filled 2024-02-27: qty 2

## 2024-02-27 MED ORDER — ACETAMINOPHEN 325 MG PO TABS: 650.0000 mg | ORAL_TABLET | ORAL | Status: DC | PRN

## 2024-02-27 MED ORDER — HEPARIN SODIUM (PORCINE) 1000 UNIT/ML IJ SOLN
INTRAMUSCULAR | Status: DC | PRN
  Administered 2024-02-27: 6000 [IU] via INTRAVENOUS

## 2024-02-27 MED ORDER — LABETALOL HCL 5 MG/ML IV SOLN: 10.0000 mg | INTRAVENOUS | Status: DC | PRN

## 2024-02-27 MED ORDER — HEPARIN (PORCINE) IN NACL 1000-0.9 UT/500ML-% IV SOLN
INTRAVENOUS | Status: DC | PRN
  Administered 2024-02-27 (×2): 500 mL

## 2024-02-27 MED ORDER — SODIUM CHLORIDE 0.9% FLUSH: 3.0000 mL | Freq: Two times a day (BID) | INTRAVENOUS | Status: DC

## 2024-02-27 MED ORDER — ASPIRIN 81 MG PO CHEW
81.0000 mg | CHEWABLE_TABLET | ORAL | Status: AC
  Administered 2024-02-27: 81 mg via ORAL

## 2024-02-27 MED ORDER — ONDANSETRON HCL 4 MG/2ML IJ SOLN: 4.0000 mg | Freq: Four times a day (QID) | INTRAMUSCULAR | Status: DC | PRN

## 2024-02-27 MED ORDER — HEPARIN SODIUM (PORCINE) 1000 UNIT/ML IJ SOLN
INTRAMUSCULAR | Status: AC
  Filled 2024-02-27: qty 10

## 2024-02-27 MED ORDER — HYDRALAZINE HCL 20 MG/ML IJ SOLN: 10.0000 mg | INTRAMUSCULAR | Status: DC | PRN

## 2024-02-27 MED ORDER — IOHEXOL 350 MG/ML SOLN
INTRAVENOUS | Status: DC | PRN
  Administered 2024-02-27: 70 mL

## 2024-02-27 MED ORDER — MIDAZOLAM HCL (PF) 2 MG/2ML IJ SOLN
INTRAMUSCULAR | Status: DC | PRN
  Administered 2024-02-27: 1 mg via INTRAVENOUS

## 2024-02-27 MED ORDER — LIDOCAINE HCL 1 % IJ SOLN
INTRAMUSCULAR | Status: AC
  Filled 2024-02-27: qty 60

## 2024-02-27 MED ORDER — FREE WATER: 500.0000 mL | Freq: Once | Status: DC

## 2024-02-27 SURGICAL SUPPLY — 11 items
CATH BALLN WEDGE 5F 110CM (CATHETERS) IMPLANT
CATH INFINITI 5FR JL4 (CATHETERS) IMPLANT
CATH INFINITI AMBI 5FR TG (CATHETERS) IMPLANT
CATH INFINITI JR4 5F (CATHETERS) IMPLANT
CATH LAUNCHER 5F EBU3.5 (CATHETERS) IMPLANT
DEVICE RAD COMP TR BAND LRG (VASCULAR PRODUCTS) IMPLANT
GLIDESHEATH SLEND A-KIT 6F 22G (SHEATH) IMPLANT
GUIDEWIRE INQWIRE 1.5J.035X260 (WIRE) IMPLANT
PACK CARDIAC CATHETERIZATION (CUSTOM PROCEDURE TRAY) ×1 IMPLANT
SET ATX-X65L (MISCELLANEOUS) IMPLANT
SHEATH GLIDE SLENDER 4/5FR (SHEATH) IMPLANT

## 2024-02-27 NOTE — Interval H&P Note (Signed)
 History and Physical Interval Note:  02/27/2024 1:42 PM  Dustin [name]  has presented today for surgery, with the diagnosis of severe AI.  The various methods of treatment have been discussed with the patient and family. After consideration of risks, benefits and other options for treatment, the patient has consented to  Procedure(s): RIGHT/LEFT HEART CATH AND CORONARY ANGIOGRAPHY (N/A) as a surgical intervention.  The patient's history has been reviewed, patient examined, no change in status, stable for surgery.  I have reviewed the patient's chart and labs.  Questions were answered to the patient's satisfaction.     Ebrahim Deremer J Jabron Weese

## 2024-02-27 NOTE — CV Procedure (Addendum)
 Post TEE Procedure Note  INDICATIONS: Aortic valve disease  PROCEDURE:   Informed consent was obtained prior to the procedure. The risks, benefits and alternatives for the procedure were discussed and the patient comprehended these risks.  Risks include, but are not limited to, cough, sore throat, vomiting, nausea, somnolence, esophageal and stomach trauma or perforation, bleeding, low blood pressure, aspiration, pneumonia, infection, trauma to the teeth and death.    After a procedural time-out, anesthesia was administered by the anesthesia team. Anesthesia was monitored by Dr. Mallory.   The transesophageal probe was inserted in the esophagus and stomach without difficulty and multiple views were obtained.  The patient was kept under observation until the patient left the procedure room.  The patient left the procedure room in stable condition.   Agitated microbubble saline contrast was not administered.  COMPLICATIONS:    There were no immediate complications.  FINDINGS:  - Bicuspid aortic valve with fusion and calcification of the right and left coronary cusps - Prolapse of the noncoronary cusp of the aortic valve - Increased transaortic gradient likely due to increased velocity from aortic regurgitation and unlikely aortic stenosis (addendum) - Likely severe aortic regurgitation - Anomalous right sided pulmonary vein - Dilated ascending aorta (4.0 cm)  RECOMMENDATIONS:     Continue with planned L/RHC this afternoon  Time Spent Directly with the Patient:  40 minutes   Emeline FORBES Calender 02/27/2024, 12:54 PM

## 2024-02-27 NOTE — Transfer of Care (Signed)
 Immediate Anesthesia Transfer of Care Note  Patient: Dustin [name]  Procedure(s) Performed: TRANSESOPHAGEAL ECHOCARDIOGRAM  Patient Location: PACU and Cath Lab  Anesthesia Type:MAC  Level of Consciousness: drowsy and patient cooperative  Airway & Oxygen Therapy: Patient Spontanous Breathing and Patient connected to face mask oxygen  Post-op Assessment: Report given to RN, Post -op Vital signs reviewed and stable, and Patient moving all extremities  Post vital signs: Reviewed and stable  Last Vitals:  Vitals Value Taken Time  BP 89/42 02/27/24 12:39  Temp 36.5 C 02/27/24 12:39  Pulse 71 02/27/24 12:40  Resp 20 02/27/24 12:40  SpO2 98 % 02/27/24 12:40  Vitals shown include unfiled device data.  Last Pain:  Vitals:   02/27/24 1239  TempSrc: Tympanic  PainSc: Asleep         Complications: No notable events documented.

## 2024-02-27 NOTE — Discharge Instructions (Signed)

## 2024-02-27 NOTE — Progress Notes (Signed)
 Pt and 24 hr person received discharge instructions, teach back performed. Pressure drsg applied to rt radial site, site is clean dry intact, site is soft, no signs of bleeding. IV removed, no complications. Pt escorted out via wheelchair to 24 hr persons vehicle.

## 2024-02-27 NOTE — Interval H&P Note (Signed)
 History and Physical Interval Note:  02/27/2024 10:41 AM  Dustin [name]  has presented today for surgery, with the diagnosis of severe AI.  The various methods of treatment have been discussed with the patient and family. After consideration of risks, benefits and other options for treatment, the patient has consented to  Procedure(s): TRANSESOPHAGEAL ECHOCARDIOGRAM (N/A) as a surgical intervention.  The patient's history has been reviewed, patient examined, no change in status, stable for surgery.  I have reviewed the patient's chart and labs.  Questions were answered to the patient's satisfaction.     Emeline FORBES Calender

## 2024-02-27 NOTE — Interval H&P Note (Signed)
 History and Physical Interval Note:  02/27/2024 1:26 PM  Dustin [name]  has presented today for surgery, with the diagnosis of severe AI.  The various methods of treatment have been discussed with the patient and family. After consideration of risks, benefits and other options for treatment, the patient has consented to  Procedure(s): RIGHT/LEFT HEART CATH AND CORONARY ANGIOGRAPHY (N/A) as a surgical intervention.  The patient's history has been reviewed, patient examined, no change in status, stable for surgery.  I have reviewed the patient's chart and labs.  Questions were answered to the patient's satisfaction.     Dustin [name]

## 2024-02-27 NOTE — Anesthesia Preprocedure Evaluation (Addendum)
 Anesthesia Evaluation  Patient identified by MRN, date of birth, ID band Patient awake    Reviewed: Allergy & Precautions, H&P , NPO status , Patient's Chart, lab work & pertinent test results  Airway Mallampati: II  TM Distance: >3 FB Neck ROM: Full    Dental no notable dental hx. (+) Teeth Intact, Dental Advisory Given, Caps   Pulmonary neg pulmonary ROS, former smoker   Pulmonary exam normal breath sounds clear to auscultation       Cardiovascular Exercise Tolerance: Good hypertension, Pt. on home beta blockers negative cardio ROS Normal cardiovascular exam+ Valvular Problems/Murmurs AI  Rhythm:Regular Rate:Normal  ECHO 11/24 IMPRESSIONS  1. Left ventricular ejection fraction, by estimation, is 60 to 65%. The  left ventricle has normal function. The left ventricle has no regional  wall motion abnormalities. There is mild left ventricular hypertrophy.  Left ventricular diastolic parameters  are consistent with Grade I diastolic dysfunction (impaired relaxation).   2. Right ventricular systolic function is normal. The right ventricular  size is normal.   3. Left atrial size was mildly dilated.   4. The mitral valve is grossly normal. Trivial mitral valve  regurgitation.   5. The aortic valve has an indeterminant number of cusps. There is  moderate calcification of the aortic valve. Aortic valve regurgitation is  severe. Mild to moderate aortic valve stenosis. Aortic regurgitation PHT  measures 324 msec. Aortic valve area,  by VTI measures 1.96 cm. Aortic valve mean gradient measures 29.5 mmHg.  Aortic valve Vmax measures 3.65 m/s.   6. Aortic dilatation noted. 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   7. The inferior vena cava is normal in size with greater than 50%  respiratory variability, suggesting right atrial pressure of 3  mmHg.     Neuro/Psych negative neurological ROS  negative psych ROS   GI/Hepatic negative GI ROS, Neg liver ROS,,,  Endo/Other  negative endocrine ROS    Renal/GU negative Renal ROS  negative genitourinary   Musculoskeletal negative musculoskeletal ROS (+)    Abdominal   Peds negative pediatric ROS (+)  Hematology negative hematology ROS (+)   Anesthesia Other Findings   Reproductive/Obstetrics negative OB ROS                              Anesthesia Physical Anesthesia Plan  ASA: 3  Anesthesia Plan: MAC   Post-op Pain Management: Minimal or no pain anticipated   Induction: Intravenous  PONV Risk Score and Plan: 2 and 1 and Propofol  infusion  Airway Management Planned: Mask and Natural Airway  Additional Equipment: None  Intra-op Plan:   Post-operative Plan:   Informed Consent: I have reviewed the patients History and Physical, chart, labs and discussed the procedure including the risks, benefits and alternatives for the proposed anesthesia with the patient or authorized representative who has indicated his/her understanding and acceptance.       Plan Discussed with: Anesthesiologist and CRNA  Anesthesia Plan Comments:          Anesthesia Quick Evaluation

## 2024-02-29 ENCOUNTER — Encounter (HOSPITAL_COMMUNITY): Payer: Self-pay | Admitting: Cardiology

## 2024-03-01 ENCOUNTER — Encounter (HOSPITAL_COMMUNITY): Payer: Self-pay | Admitting: Internal Medicine

## 2024-03-01 NOTE — Anesthesia Postprocedure Evaluation (Signed)
 Anesthesia Post Note  Patient: Dustin [name]  Procedure(s) Performed: TRANSESOPHAGEAL ECHOCARDIOGRAM     Patient location during evaluation: PACU Anesthesia Type: MAC Level of consciousness: awake and alert Pain management: pain level controlled Vital Signs Assessment: post-procedure vital signs reviewed and stable Respiratory status: spontaneous breathing, nonlabored ventilation, respiratory function stable and patient connected to nasal cannula oxygen Cardiovascular status: stable and blood pressure returned to baseline Postop Assessment: no apparent nausea or vomiting Anesthetic complications: no   No notable events documented.  Last Vitals:  Vitals:   02/27/24 1555 02/27/24 1625  BP: (!) 167/63 (!) 150/62  Pulse: 64 66  Resp:    Temp:    SpO2: 96% 97%    Last Pain:  Vitals:   02/27/24 1430  TempSrc:   PainSc: 0-No pain                 Loye Vento

## 2024-03-05 ENCOUNTER — Ambulatory Visit (HOSPITAL_COMMUNITY)

## 2024-03-05 ENCOUNTER — Encounter: Payer: Self-pay | Admitting: *Deleted

## 2024-03-05 ENCOUNTER — Other Ambulatory Visit: Payer: Self-pay | Admitting: *Deleted

## 2024-03-05 ENCOUNTER — Ambulatory Visit
Attending: Thoracic Surgery (Cardiothoracic Vascular Surgery) | Admitting: Thoracic Surgery (Cardiothoracic Vascular Surgery)

## 2024-03-05 VITALS — BP 128/73 | HR 63 | Resp 18 | Ht 72.0 in | Wt 269.0 lb

## 2024-03-05 DIAGNOSIS — I351 Nonrheumatic aortic (valve) insufficiency: Secondary | ICD-10-CM | POA: Diagnosis not present

## 2024-03-05 NOTE — Progress Notes (Signed)
 [address] [place] [address]             [phone]        [facility] Medical Record #[id number] Date of Birth: March 19, 1981  Referring: Elmira Newman PARAS, MD Primary Care: Sim Emery CROME, MD Primary Cardiologist:Manish PARAS Elmira, MD  Chief Complaint:    Chief Complaint  Patient presents with   Aortic Insuffiency    History of Present Illness:     Dustin [name] is a 43 y.o. male who presents for surgical evaluation of a bicuspid valve with severe aortic valve regurgitation.  He is symptomatic with exertional dyspnea.  He denies any chest pain.  He has also had lower extremity swelling.    Creatinine:  Lab Results  Component Value Date   CREATININE 0.85 02/23/2024   CREATININE 0.97 06/27/2022   CREATININE 0.75 01/13/2020     Past Medical History:  Diagnosis Date   Hyperlipidemia    Hypertension     Past Surgical History:  Procedure Laterality Date   CHOLECYSTECTOMY     RIGHT/LEFT HEART CATH AND CORONARY ANGIOGRAPHY N/A 02/27/2024   Procedure: RIGHT/LEFT HEART CATH AND CORONARY ANGIOGRAPHY;  Surgeon: Elmira Newman PARAS, MD;  Location: [facility] INVASIVE CV [facility];  Service: Cardiovascular;  Laterality: N/A;   TRANSESOPHAGEAL ECHOCARDIOGRAM (CATH LAB) N/A 02/27/2024   Procedure: TRANSESOPHAGEAL ECHOCARDIOGRAM;  Surgeon: Kriste Emeline BRAVO, DO;  Location: [facility] INVASIVE CV [facility];  Service: Cardiovascular;  Laterality: N/A;    Social History:  Tobacco Use History[1]  Social History   Substance and Sexual Activity  Alcohol Use Not Currently     Allergies[2]    Current Outpatient Medications  Medication Sig Dispense Refill   atenolol  (TENORMIN ) 50 MG tablet TAKE 1 TABLET(50 MG) BY MOUTH DAILY 90 tablet 3   No current facility-administered medications for this visit.    (Not in a hospital admission)   Family History  Problem Relation Age of Onset   Hypertension Mother    Heart attack Father    Diabetes Father     Diabetes Sister    Hypertension Sister    Diabetes Brother    Hypertension Brother      Review of Systems:   Review of Systems  Constitutional:  Positive for malaise/fatigue.  Respiratory:  Positive for shortness of breath.   Cardiovascular:  Positive for leg swelling. Negative for chest pain.      Physical Exam: BP 128/73   Pulse 63   Resp 18   Ht 6' (1.829 m)   Wt 269 lb (122 kg)   SpO2 98%   BMI 36.48 kg/m  Physical Exam Constitutional:      General: He is not in acute distress.    Appearance: He is not ill-appearing.  HENT:     Head: Normocephalic and atraumatic.  Eyes:     Extraocular Movements: Extraocular movements intact.  Cardiovascular:     Rate and Rhythm: Normal rate.  Pulmonary:     Effort: Pulmonary effort is normal. No respiratory distress.  Abdominal:     General: Abdomen is flat. There is no distension.  Musculoskeletal:        General: Normal range of motion.     Cervical back: Normal range of motion.  Skin:    General: Skin is warm and dry.  Neurological:     General: No focal deficit present.     Mental Status: He is alert and oriented to  person, place, and time.       Diagnostic Studies & Laboratory data: Cardiac Studies & Procedures   ______________________________________________________________________________________________ CARDIAC CATHETERIZATION  CARDIAC CATHETERIZATION 02/27/2024  Conclusion Coronary angiography 02/27/2024: LM: Normal LAD: Normal, no significant disease. Ramus: Normal, no significant disease. Lcx:  Normal, no significant disease. RCA: Dominant.  Normal, no significant disease.  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 Exam Location:  [address]  Procedure: 2D Echo, Cardiac Doppler, Color Doppler, 3D Echo and Strain Analysis  Indications:    Q23.81 Bicuspid aortic valve with ascending aorta 4.0 to 4.5cm in diameter; I71.2 Ascending aortic aneurysm  History:        Patient has prior history of Echocardiogram examinations, most recent 07/02/2022. Signs/Symptoms:Fatigue and Chest Pain; Risk Factors:Family History of Coronary Artery Disease, Hypertension and Dyslipidemia. Anomalous pulmonary venous drainage.  Sonographer:    Jon Hacker RCS Referring Phys: [id number] [facility] PATWARD[facility]AN  IMPRESSIONS   1. Left ventricular ejection fraction, by estimation, is 60 to 65%. The left ventricle has normal function. The left ventricle has no regional wall motion abnormalities. There is mild left ventricular hypertrophy. Left ventricular diastolic parameters are consistent with Grade I diastolic dysfunction (impaired relaxation). 2. Right ventricular systolic function is normal. The right ventricular size is normal. 3. Left atrial size was mildly dilated. 4. The mitral valve is grossly normal. Trivial mitral valve regurgitation. 5. The aortic valve has an indeterminant number of cusps. There is moderate calcification of the aortic valve. Aortic valve regurgitation is severe. Mild to moderate aortic valve stenosis. Aortic regurgitation PHT measures 324  msec. Aortic valve area, by VTI measures 1.96 cm. Aortic valve mean gradient measures 29.5 mmHg. Aortic valve Vmax measures 3.65 m/s. 6. Aortic dilatation noted. There is mild dilatation of the aortic root, measuring 40 mm. There is moderate dilatation of the ascending aorta, measuring 46 mm. 7. The inferior vena cava is normal in size with greater than 50% respiratory variability, suggesting right atrial pressure of 3 mmHg.  Comparison(s): Changes from prior study are noted. 07/02/2022: LVEF 60-65%, mild LVH, moderate AS, moderate to severe AI.  FINDINGS Left Ventricle: Left ventricular ejection fraction, by estimation, is 60 to 65%. The left ventricle has normal function. The left ventricle has no regional wall motion abnormalities. The left ventricular internal cavity  size was normal in size. There is mild left ventricular hypertrophy. Left ventricular diastolic parameters are consistent with Grade I diastolic dysfunction (impaired relaxation). Indeterminate filling pressures.  Right Ventricle: The right ventricular size is normal. No increase in right ventricular wall thickness. Right ventricular systolic function is normal.  Left Atrium: Left atrial size was mildly dilated.  Right Atrium: Right atrial size was normal in size.  Pericardium: There is no evidence of pericardial effusion.  Mitral Valve: The mitral valve is grossly normal. Trivial mitral valve regurgitation.  Tricuspid Valve: The tricuspid valve is grossly normal. Tricuspid valve regurgitation is trivial.  Aortic Valve: The aortic valve has an indeterminant number of cusps. There is moderate calcification of the aortic valve. Aortic valve regurgitation is severe. Aortic regurgitation PHT measures 324 msec. Mild to moderate aortic stenosis is present. Aortic valve mean gradient measures 29.5 mmHg. Aortic valve peak gradient measures 53.3 mmHg. Aortic valve area, by VTI measures 1.96 cm.  Pulmonic Valve: The pulmonic valve  was not well visualized. Pulmonic valve regurgitation is not visualized.  Aorta: Aortic dilatation noted. There is mild dilatation of the aortic root, measuring 40 mm. There is moderate dilatation of the ascending aorta, measuring 46 mm.  Venous: The inferior vena cava is normal in size with greater than 50% respiratory variability, suggesting right atrial pressure of 3 mmHg.  IAS/Shunts: No atrial level shunt detected by color flow Doppler.   LEFT VENTRICLE PLAX 2D LVIDd:         5.20 cm   Diastology LVIDs:         2.90 cm   LV e' medial:    6.96 cm/s LV PW:         1.30 cm   LV E/e' medial:  10.2 LV IVS:        1.10 cm   LV e' lateral:   8.16 cm/s LVOT diam:     2.60 cm   LV E/e' lateral: 8.7 LV SV:         183 LV SV Index:   76 LVOT Area:     5.31 cm  3D Volume EF: 3D EF:        59 % LV EDV:       342 ml LV ESV:       141 ml LV SV:        201 ml  RIGHT VENTRICLE RV Basal diam:  3.40 cm RV S prime:     14.90 cm/s TAPSE (M-mode): 2.8 cm  LEFT ATRIUM             Index        RIGHT ATRIUM           Index LA diam:        4.30 cm 1.78 cm/m   RA Area:     17.70 cm LA Vol (A2C):   58.3 ml 24.16 ml/m  RA Volume:   46.90 ml  19.43 ml/m LA Vol (A4C):   85.6 ml 35.47 ml/m LA Biplane Vol: 70.7 ml 29.30 ml/m AORTIC VALVE AV Area (Vmax):    1.93 cm AV Area (Vmean):   1.91 cm AV Area (VTI):     1.96 cm AV Vmax:           365.00 cm/s AV Vmean:          248.000 cm/s AV VTI:            0.934 m AV Peak Grad:      53.3  mmHg AV Mean Grad:      29.5 mmHg LVOT Vmax:         133.00 cm/s LVOT Vmean:        89.100 cm/s LVOT VTI:          0.344 m LVOT/AV VTI ratio: 0.37 AI PHT:            324 msec  AORTA Ao Root diam: 4.00 cm Ao Asc diam:  4.60 cm  MITRAL VALVE MV Area (PHT): 2.41 cm    SHUNTS MV Decel Time: 315 msec    Systemic VTI:  0.34 m MV E velocity: 71.00 cm/s  Systemic Diam: 2.60 cm MV A velocity: 66.50 cm/s MV E/A ratio:  1.07  Vinie Maxcy MD Electronically  signed by Vinie Maxcy MD Signature Date/Time: 01/30/2023/12:57:31 PM    Final   TEE  ECHO TEE 02/27/2024  Narrative TRANSESOPHOGEAL ECHO REPORT    Patient Name:   Dustin [name] Date of Exam: 02/27/2024 Medical Rec #:  [id number]     Height:       72.0 in Accession #:    [id number]    Weight:       267.0 lb Date of Birth:  Aug 08, 1980     BSA:          2.409 m Patient Age:    43 years      BP:           175/46 mmHg Patient Gender: M             HR:           94 bpm. Exam Location:  Inpatient  Procedure: Transesophageal Echo, Cardiac Doppler, Color Doppler and 3D Echo (Both Spectral and Color Flow Doppler were utilized during procedure).  Indications:     Aortic Valve Disorder  History:         Patient has prior history of Echocardiogram examinations, most recent 01/30/2023. Aortic Valve Disease; Risk Factors:Hypertension, Dyslipidemia and Family History of Coronary Artery Disease.  Sonographer:     Philomena Daring Referring Phys:  [id number] EMELINE BRAVO SEGAL Diagnosing Phys: Emeline Calender  PROCEDURE: After discussion of the risks and benefits of a TEE, an informed consent was obtained from the patient. The transesophogeal probe was passed without difficulty through the esophogus of the patient. Imaged were obtained with the patient in a left lateral decubitus position. Sedation performed by different physician. The patient was monitored while under deep sedation. Anesthestetic sedation was provided intravenously by Anesthesiology: 476mg  of Propofol . Image quality was excellent. The patient developed no complications during the procedure.  IMPRESSIONS   1. Left ventricular ejection fraction, by estimation, is 60 to 65%. The left ventricle has normal function. 2. Right ventricular systolic function is normal. The right ventricular size is normal. 3. Left atrial size was moderately dilated. No left atrial/left atrial appendage thrombus was detected. The LAA emptying velocity was 71  cm/s. 4. Right atrial size was moderately dilated. 5. The mitral valve is normal in structure. Mild mitral valve regurgitation. 6. The aortic valve is bicuspid. Aortic valve regurgitation is severe. Aortic valve sclerosis/calcification is present, without any evidence of aortic stenosis. 7. 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 8. Aortic dilatation noted. There is mild dilatation of the ascending aorta, measuring 41 mm. 9. Anomalous middle right pulmonary vein. 10. 3D performed of the aortic valve and demonstrates Bicuspid aortic valve.  Conclusion(s)/Recommendation(s): Consider cardiothoracic surgical evaluation.  FINDINGS Left Ventricle: Left ventricular ejection fraction, by estimation, is  60 to 65%. The left ventricle has normal function. The left ventricular internal cavity size was normal in size.  Right Ventricle: The right ventricular size is normal. No increase in right ventricular wall thickness. Right ventricular systolic function is normal.  Left Atrium: Left atrial size was moderately dilated. No left atrial/left atrial appendage thrombus was detected. The LAA emptying velocity was 71 cm/s.  Right Atrium: Right atrial size was moderately dilated.  Pericardium: There is no evidence of pericardial effusion.  Mitral Valve: The mitral valve is normal in structure. Mild mitral valve regurgitation.  Tricuspid Valve: The tricuspid valve is normal in structure. Tricuspid valve regurgitation is trivial.  Aortic Valve: Fusion of the right and left coronary cusps with calcification. There is mild prolapse of the noncoronary cusp. Velocity and gradient are elevated and in severely stenosed range but the leaflets have good mobility. Therefore, less likely severe aortic stenosis and more likely increased velocities from severe aortic regurgitation and high flow state. The aortic valve is bicuspid. Aortic valve regurgitation is severe. Aortic valve sclerosis/calcification is  present, without any evidence of aortic stenosis. Aortic valve mean gradient measures 36.0 mmHg. Aortic valve peak gradient measures 64.3 mmHg.  Pulmonic Valve: The pulmonic valve was normal in structure. Pulmonic valve regurgitation is trivial.  Aorta: Aortic dilatation noted. There is mild dilatation of the ascending aorta, measuring 41 mm.  Venous: Anomalous middle right pulmonary vein.  IAS/Shunts: No atrial level shunt detected by color flow Doppler.  Additional Comments: 3D was performed not requiring image post processing on an independent workstation and was abnormal. Spectral Doppler performed.  AORTIC VALVE AV Vmax:      401.00 cm/s AV Peak Grad: 64.3 mmHg AV Mean Grad: 36.0 mmHg  AORTA Ao Asc diam: 4.05 cm  Emeline Calender Electronically signed by Emeline Calender Signature Date/Time: 02/27/2024/3:07:21 PM    Final  MONITORS  LONG TERM MONITOR (3-14 DAYS) 03/01/2020  Narrative Mobile cardiac telemetry 13 days 03/01/2020 - 03/14/2020: Dominant rhythm: Sinus. HR 47-144 bpm. Avg HR 80 bpm. 1 episodes of atrial tachycardia at 141 bpm for 4 beats. Rare isolated SVE, couplets. Rare isolated VE, couplets No atrial fibrillation/atrial flutter/VT/high grade AV block, sinus pause >3sec noted. 18 patient triggered events correlated with sinus rhythm.   CT SCANS  CT CORONARY MORPH W/CTA COR W/SCORE 02/22/2020  Addendum 02/22/2020  3:52 PM ADDENDUM REPORT: 02/22/2020 15:50  CLINICAL DATA:  43 Year-old Lebanese Male  Demographic assessment for MESA uses Asian subset  EXAM: Cardiac/Coronary  CTA  TECHNIQUE: The patient was scanned on a [employer].  FINDINGS: A 100 kV prospective scan was triggered in the descending thoracic aorta at 111 HU's. Axial non-contrast 3 mm slices were carried out through the heart. The data set was analyzed on a dedicated work station and scored using the Agatson method. Gantry rotation speed was 250 msecs and collimation was .6  mm. No beta blockade and 0.8 mg of sl NTG was given. The 3D data set was reconstructed in 5% intervals of the 67-82 % of the R-R cycle. Diastolic phases were analyzed on a dedicated work station using MPR, MIP and VRT modes. The patient received 80 cc of contrast.  Aorta: Mild ascending aortic dilation, maximal diameter 41 mm. No calcifications. No dissection.  Aortic Valve: Morphology consistent with bicuspid aortic valve. Moderate to severe leaflet calcification that extends into the left ventricular outflow tract. Aortic valve calcium  score 1448.  Coronary Arteries:  Normal coronary origin.  Right dominance.  Coronary calcium   score of 0. This was 1st percentile for age, sex, and race matched control.  RCA is a large dominant artery that gives rise to PDA and PLA. There is no plaque.  Left main is a large artery that gives rise to LAD and LCX arteries.  LAD is a large vessel that gives rise to two diagonal branches. There is no plaque.  LCX is a non-dominant artery that gives rise to one large OM1 branch. There is no plaque.  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  Normal left atrial appendage without a thrombus.  Normal size of the pulmonary artery.  Extra-cardiac findings: See attached radiology report for non-cardiac structures.  IMPRESSION: 1. Coronary calcium  score of 0. This was 1st percentile for age, sex, and race matched control.  2. Normal coronary origin.  Right dominance.  3. CAD-RADS 0. No evidence of CAD (0%). Consider non-atherosclerotic causes of chest pain.  4. Bicuspid aortic valve with moderate to severe leaflet calcification that extends into the left ventricular outflow tract. Aortic valve calcium  score 1448.  5. Mild ascending aortic dilation, maximal diameter 41 mm.  6. 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  Will reach out to primary cardiologist regarding findings.   Electronically Signed By: Stanly Leavens MD On: 02/22/2020 15:50  Narrative EXAM: OVER-READ INTERPRETATION  CT CHEST  The following report is an over-read performed by radiologist Dr. Rockey Kilts of [facility] on 02/22/2020. This over-read does not include interpretation of cardiac or coronary anatomy or pathology. The coronary CTA interpretation by the cardiologist is attached.  COMPARISON:  01/13/2020 chest radiograph  FINDINGS: Vascular: Normal aortic caliber. No central pulmonary embolism, on this non-dedicated study.  Mediastinum/Nodes: No imaged thoracic adenopathy.  Lungs/Pleura: No pleural fluid.  Clear imaged lungs.  Upper Abdomen: Normal imaged portions of the liver, spleen.  Musculoskeletal: No acute osseous abnormality.  IMPRESSION: No acute findings in the imaged extracardiac chest.  Electronically Signed: By: Rockey Kilts M.D. On: 02/22/2020 14:19     ______________________________________________________________________________________________     EKG: Sinus I have independently reviewed the above radiologic studies and discussed with the patient   Recent Lab Findings: Lab Results  Component Value Date   WBC 8.0 02/23/2024   HGB 13.3 02/27/2024   HGB 13.3 02/27/2024   HCT 39.0 02/27/2024   HCT 39.0 02/27/2024   PLT 229 02/23/2024   GLUCOSE 98 02/23/2024   ALT 19 01/25/2018   AST 20 01/25/2018   NA 141 02/27/2024   NA 141 02/27/2024   K 4.2 02/27/2024   K 4.3 02/27/2024   CL 103 02/23/2024   CREATININE 0.85 02/23/2024   BUN 15 02/23/2024   CO2 22 02/23/2024      Assessment / Plan:   43 y.o. male with severe aortic valve regurgitation secondary to a bicuspid valve.  He also has an ascending aneurysm of 4 cm.  We discussed the risks and benefits of a mechanical aortic valve replacement.  He is agreeable to proceed.  He will  need dental clearance.     I  spent 40 minutes counseling the patient face to face.   Dustin [name] 03/05/2024 11:12 AM           [1]  Social History Tobacco Use  Smoking Status Former   Current packs/day: 0.00   Average packs/day: 0.3 packs/day for 1  year (0.3 ttl pk-yrs)   Types: Cigarettes   Start date: 2014   Quit date: 2015   Years since quitting: 10.9  Smokeless Tobacco Never  [2] No Known Allergies

## 2024-03-05 NOTE — H&P (View-Only) (Signed)
 [address] [place] [address]             [phone]        [facility] Medical Record #[id number] Date of Birth: March 19, 1981  Referring: Elmira Newman PARAS, MD Primary Care: Sim Emery CROME, MD Primary Cardiologist:Manish PARAS Elmira, MD  Chief Complaint:    Chief Complaint  Patient presents with   Aortic Insuffiency    History of Present Illness:     Dustin [name] is a 43 y.o. male who presents for surgical evaluation of a bicuspid valve with severe aortic valve regurgitation.  He is symptomatic with exertional dyspnea.  He denies any chest pain.  He has also had lower extremity swelling.    Creatinine:  Lab Results  Component Value Date   CREATININE 0.85 02/23/2024   CREATININE 0.97 06/27/2022   CREATININE 0.75 01/13/2020     Past Medical History:  Diagnosis Date   Hyperlipidemia    Hypertension     Past Surgical History:  Procedure Laterality Date   CHOLECYSTECTOMY     RIGHT/LEFT HEART CATH AND CORONARY ANGIOGRAPHY N/A 02/27/2024   Procedure: RIGHT/LEFT HEART CATH AND CORONARY ANGIOGRAPHY;  Surgeon: Elmira Newman PARAS, MD;  Location: [facility] INVASIVE CV [facility];  Service: Cardiovascular;  Laterality: N/A;   TRANSESOPHAGEAL ECHOCARDIOGRAM (CATH LAB) N/A 02/27/2024   Procedure: TRANSESOPHAGEAL ECHOCARDIOGRAM;  Surgeon: Kriste Emeline BRAVO, DO;  Location: [facility] INVASIVE CV [facility];  Service: Cardiovascular;  Laterality: N/A;    Social History:  Tobacco Use History[1]  Social History   Substance and Sexual Activity  Alcohol Use Not Currently     Allergies[2]    Current Outpatient Medications  Medication Sig Dispense Refill   atenolol  (TENORMIN ) 50 MG tablet TAKE 1 TABLET(50 MG) BY MOUTH DAILY 90 tablet 3   No current facility-administered medications for this visit.    (Not in a hospital admission)   Family History  Problem Relation Age of Onset   Hypertension Mother    Heart attack Father    Diabetes Father     Diabetes Sister    Hypertension Sister    Diabetes Brother    Hypertension Brother      Review of Systems:   Review of Systems  Constitutional:  Positive for malaise/fatigue.  Respiratory:  Positive for shortness of breath.   Cardiovascular:  Positive for leg swelling. Negative for chest pain.      Physical Exam: BP 128/73   Pulse 63   Resp 18   Ht 6' (1.829 m)   Wt 269 lb (122 kg)   SpO2 98%   BMI 36.48 kg/m  Physical Exam Constitutional:      General: He is not in acute distress.    Appearance: He is not ill-appearing.  HENT:     Head: Normocephalic and atraumatic.  Eyes:     Extraocular Movements: Extraocular movements intact.  Cardiovascular:     Rate and Rhythm: Normal rate.  Pulmonary:     Effort: Pulmonary effort is normal. No respiratory distress.  Abdominal:     General: Abdomen is flat. There is no distension.  Musculoskeletal:        General: Normal range of motion.     Cervical back: Normal range of motion.  Skin:    General: Skin is warm and dry.  Neurological:     General: No focal deficit present.     Mental Status: He is alert and oriented to  person, place, and time.       Diagnostic Studies & Laboratory data: Cardiac Studies & Procedures   ______________________________________________________________________________________________ CARDIAC CATHETERIZATION  CARDIAC CATHETERIZATION 02/27/2024  Conclusion Coronary angiography 02/27/2024: LM: Normal LAD: Normal, no significant disease. Ramus: Normal, no significant disease. Lcx:  Normal, no significant disease. RCA: Dominant.  Normal, no significant disease.  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 Exam Location:  [address]  Procedure: 2D Echo, Cardiac Doppler, Color Doppler, 3D Echo and Strain Analysis  Indications:    Q23.81 Bicuspid aortic valve with ascending aorta 4.0 to 4.5cm in diameter; I71.2 Ascending aortic aneurysm  History:        Patient has prior history of Echocardiogram examinations, most recent 07/02/2022. Signs/Symptoms:Fatigue and Chest Pain; Risk Factors:Family History of Coronary Artery Disease, Hypertension and Dyslipidemia. Anomalous pulmonary venous drainage.  Sonographer:    Jon Hacker RCS Referring Phys: [id number] [facility] PATWARD[facility]AN  IMPRESSIONS   1. Left ventricular ejection fraction, by estimation, is 60 to 65%. The left ventricle has normal function. The left ventricle has no regional wall motion abnormalities. There is mild left ventricular hypertrophy. Left ventricular diastolic parameters are consistent with Grade I diastolic dysfunction (impaired relaxation). 2. Right ventricular systolic function is normal. The right ventricular size is normal. 3. Left atrial size was mildly dilated. 4. The mitral valve is grossly normal. Trivial mitral valve regurgitation. 5. The aortic valve has an indeterminant number of cusps. There is moderate calcification of the aortic valve. Aortic valve regurgitation is severe. Mild to moderate aortic valve stenosis. Aortic regurgitation PHT measures 324  msec. Aortic valve area, by VTI measures 1.96 cm. Aortic valve mean gradient measures 29.5 mmHg. Aortic valve Vmax measures 3.65 m/s. 6. Aortic dilatation noted. There is mild dilatation of the aortic root, measuring 40 mm. There is moderate dilatation of the ascending aorta, measuring 46 mm. 7. The inferior vena cava is normal in size with greater than 50% respiratory variability, suggesting right atrial pressure of 3 mmHg.  Comparison(s): Changes from prior study are noted. 07/02/2022: LVEF 60-65%, mild LVH, moderate AS, moderate to severe AI.  FINDINGS Left Ventricle: Left ventricular ejection fraction, by estimation, is 60 to 65%. The left ventricle has normal function. The left ventricle has no regional wall motion abnormalities. The left ventricular internal cavity  size was normal in size. There is mild left ventricular hypertrophy. Left ventricular diastolic parameters are consistent with Grade I diastolic dysfunction (impaired relaxation). Indeterminate filling pressures.  Right Ventricle: The right ventricular size is normal. No increase in right ventricular wall thickness. Right ventricular systolic function is normal.  Left Atrium: Left atrial size was mildly dilated.  Right Atrium: Right atrial size was normal in size.  Pericardium: There is no evidence of pericardial effusion.  Mitral Valve: The mitral valve is grossly normal. Trivial mitral valve regurgitation.  Tricuspid Valve: The tricuspid valve is grossly normal. Tricuspid valve regurgitation is trivial.  Aortic Valve: The aortic valve has an indeterminant number of cusps. There is moderate calcification of the aortic valve. Aortic valve regurgitation is severe. Aortic regurgitation PHT measures 324 msec. Mild to moderate aortic stenosis is present. Aortic valve mean gradient measures 29.5 mmHg. Aortic valve peak gradient measures 53.3 mmHg. Aortic valve area, by VTI measures 1.96 cm.  Pulmonic Valve: The pulmonic valve  was not well visualized. Pulmonic valve regurgitation is not visualized.  Aorta: Aortic dilatation noted. There is mild dilatation of the aortic root, measuring 40 mm. There is moderate dilatation of the ascending aorta, measuring 46 mm.  Venous: The inferior vena cava is normal in size with greater than 50% respiratory variability, suggesting right atrial pressure of 3 mmHg.  IAS/Shunts: No atrial level shunt detected by color flow Doppler.   LEFT VENTRICLE PLAX 2D LVIDd:         5.20 cm   Diastology LVIDs:         2.90 cm   LV e' medial:    6.96 cm/s LV PW:         1.30 cm   LV E/e' medial:  10.2 LV IVS:        1.10 cm   LV e' lateral:   8.16 cm/s LVOT diam:     2.60 cm   LV E/e' lateral: 8.7 LV SV:         183 LV SV Index:   76 LVOT Area:     5.31 cm  3D Volume EF: 3D EF:        59 % LV EDV:       342 ml LV ESV:       141 ml LV SV:        201 ml  RIGHT VENTRICLE RV Basal diam:  3.40 cm RV S prime:     14.90 cm/s TAPSE (M-mode): 2.8 cm  LEFT ATRIUM             Index        RIGHT ATRIUM           Index LA diam:        4.30 cm 1.78 cm/m   RA Area:     17.70 cm LA Vol (A2C):   58.3 ml 24.16 ml/m  RA Volume:   46.90 ml  19.43 ml/m LA Vol (A4C):   85.6 ml 35.47 ml/m LA Biplane Vol: 70.7 ml 29.30 ml/m AORTIC VALVE AV Area (Vmax):    1.93 cm AV Area (Vmean):   1.91 cm AV Area (VTI):     1.96 cm AV Vmax:           365.00 cm/s AV Vmean:          248.000 cm/s AV VTI:            0.934 m AV Peak Grad:      53.3  mmHg AV Mean Grad:      29.5 mmHg LVOT Vmax:         133.00 cm/s LVOT Vmean:        89.100 cm/s LVOT VTI:          0.344 m LVOT/AV VTI ratio: 0.37 AI PHT:            324 msec  AORTA Ao Root diam: 4.00 cm Ao Asc diam:  4.60 cm  MITRAL VALVE MV Area (PHT): 2.41 cm    SHUNTS MV Decel Time: 315 msec    Systemic VTI:  0.34 m MV E velocity: 71.00 cm/s  Systemic Diam: 2.60 cm MV A velocity: 66.50 cm/s MV E/A ratio:  1.07  Vinie Maxcy MD Electronically  signed by Vinie Maxcy MD Signature Date/Time: 01/30/2023/12:57:31 PM    Final   TEE  ECHO TEE 02/27/2024  Narrative TRANSESOPHOGEAL ECHO REPORT    Patient Name:   Dustin [name] Date of Exam: 02/27/2024 Medical Rec #:  [id number]     Height:       72.0 in Accession #:    [id number]    Weight:       267.0 lb Date of Birth:  Aug 08, 1980     BSA:          2.409 m Patient Age:    43 years      BP:           175/46 mmHg Patient Gender: M             HR:           94 bpm. Exam Location:  Inpatient  Procedure: Transesophageal Echo, Cardiac Doppler, Color Doppler and 3D Echo (Both Spectral and Color Flow Doppler were utilized during procedure).  Indications:     Aortic Valve Disorder  History:         Patient has prior history of Echocardiogram examinations, most recent 01/30/2023. Aortic Valve Disease; Risk Factors:Hypertension, Dyslipidemia and Family History of Coronary Artery Disease.  Sonographer:     Philomena Daring Referring Phys:  [id number] EMELINE BRAVO SEGAL Diagnosing Phys: Emeline Calender  PROCEDURE: After discussion of the risks and benefits of a TEE, an informed consent was obtained from the patient. The transesophogeal probe was passed without difficulty through the esophogus of the patient. Imaged were obtained with the patient in a left lateral decubitus position. Sedation performed by different physician. The patient was monitored while under deep sedation. Anesthestetic sedation was provided intravenously by Anesthesiology: 476mg  of Propofol . Image quality was excellent. The patient developed no complications during the procedure.  IMPRESSIONS   1. Left ventricular ejection fraction, by estimation, is 60 to 65%. The left ventricle has normal function. 2. Right ventricular systolic function is normal. The right ventricular size is normal. 3. Left atrial size was moderately dilated. No left atrial/left atrial appendage thrombus was detected. The LAA emptying velocity was 71  cm/s. 4. Right atrial size was moderately dilated. 5. The mitral valve is normal in structure. Mild mitral valve regurgitation. 6. The aortic valve is bicuspid. Aortic valve regurgitation is severe. Aortic valve sclerosis/calcification is present, without any evidence of aortic stenosis. 7. 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 8. Aortic dilatation noted. There is mild dilatation of the ascending aorta, measuring 41 mm. 9. Anomalous middle right pulmonary vein. 10. 3D performed of the aortic valve and demonstrates Bicuspid aortic valve.  Conclusion(s)/Recommendation(s): Consider cardiothoracic surgical evaluation.  FINDINGS Left Ventricle: Left ventricular ejection fraction, by estimation, is  60 to 65%. The left ventricle has normal function. The left ventricular internal cavity size was normal in size.  Right Ventricle: The right ventricular size is normal. No increase in right ventricular wall thickness. Right ventricular systolic function is normal.  Left Atrium: Left atrial size was moderately dilated. No left atrial/left atrial appendage thrombus was detected. The LAA emptying velocity was 71 cm/s.  Right Atrium: Right atrial size was moderately dilated.  Pericardium: There is no evidence of pericardial effusion.  Mitral Valve: The mitral valve is normal in structure. Mild mitral valve regurgitation.  Tricuspid Valve: The tricuspid valve is normal in structure. Tricuspid valve regurgitation is trivial.  Aortic Valve: Fusion of the right and left coronary cusps with calcification. There is mild prolapse of the noncoronary cusp. Velocity and gradient are elevated and in severely stenosed range but the leaflets have good mobility. Therefore, less likely severe aortic stenosis and more likely increased velocities from severe aortic regurgitation and high flow state. The aortic valve is bicuspid. Aortic valve regurgitation is severe. Aortic valve sclerosis/calcification is  present, without any evidence of aortic stenosis. Aortic valve mean gradient measures 36.0 mmHg. Aortic valve peak gradient measures 64.3 mmHg.  Pulmonic Valve: The pulmonic valve was normal in structure. Pulmonic valve regurgitation is trivial.  Aorta: Aortic dilatation noted. There is mild dilatation of the ascending aorta, measuring 41 mm.  Venous: Anomalous middle right pulmonary vein.  IAS/Shunts: No atrial level shunt detected by color flow Doppler.  Additional Comments: 3D was performed not requiring image post processing on an independent workstation and was abnormal. Spectral Doppler performed.  AORTIC VALVE AV Vmax:      401.00 cm/s AV Peak Grad: 64.3 mmHg AV Mean Grad: 36.0 mmHg  AORTA Ao Asc diam: 4.05 cm  Emeline Calender Electronically signed by Emeline Calender Signature Date/Time: 02/27/2024/3:07:21 PM    Final  MONITORS  LONG TERM MONITOR (3-14 DAYS) 03/01/2020  Narrative Mobile cardiac telemetry 13 days 03/01/2020 - 03/14/2020: Dominant rhythm: Sinus. HR 47-144 bpm. Avg HR 80 bpm. 1 episodes of atrial tachycardia at 141 bpm for 4 beats. Rare isolated SVE, couplets. Rare isolated VE, couplets No atrial fibrillation/atrial flutter/VT/high grade AV block, sinus pause >3sec noted. 18 patient triggered events correlated with sinus rhythm.   CT SCANS  CT CORONARY MORPH W/CTA COR W/SCORE 02/22/2020  Addendum 02/22/2020  3:52 PM ADDENDUM REPORT: 02/22/2020 15:50  CLINICAL DATA:  43 Year-old Lebanese Male  Demographic assessment for MESA uses Asian subset  EXAM: Cardiac/Coronary  CTA  TECHNIQUE: The patient was scanned on a [employer].  FINDINGS: A 100 kV prospective scan was triggered in the descending thoracic aorta at 111 HU's. Axial non-contrast 3 mm slices were carried out through the heart. The data set was analyzed on a dedicated work station and scored using the Agatson method. Gantry rotation speed was 250 msecs and collimation was .6  mm. No beta blockade and 0.8 mg of sl NTG was given. The 3D data set was reconstructed in 5% intervals of the 67-82 % of the R-R cycle. Diastolic phases were analyzed on a dedicated work station using MPR, MIP and VRT modes. The patient received 80 cc of contrast.  Aorta: Mild ascending aortic dilation, maximal diameter 41 mm. No calcifications. No dissection.  Aortic Valve: Morphology consistent with bicuspid aortic valve. Moderate to severe leaflet calcification that extends into the left ventricular outflow tract. Aortic valve calcium  score 1448.  Coronary Arteries:  Normal coronary origin.  Right dominance.  Coronary calcium   score of 0. This was 1st percentile for age, sex, and race matched control.  RCA is a large dominant artery that gives rise to PDA and PLA. There is no plaque.  Left main is a large artery that gives rise to LAD and LCX arteries.  LAD is a large vessel that gives rise to two diagonal branches. There is no plaque.  LCX is a non-dominant artery that gives rise to one large OM1 branch. There is no plaque.  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  Normal left atrial appendage without a thrombus.  Normal size of the pulmonary artery.  Extra-cardiac findings: See attached radiology report for non-cardiac structures.  IMPRESSION: 1. Coronary calcium  score of 0. This was 1st percentile for age, sex, and race matched control.  2. Normal coronary origin.  Right dominance.  3. CAD-RADS 0. No evidence of CAD (0%). Consider non-atherosclerotic causes of chest pain.  4. Bicuspid aortic valve with moderate to severe leaflet calcification that extends into the left ventricular outflow tract. Aortic valve calcium  score 1448.  5. Mild ascending aortic dilation, maximal diameter 41 mm.  6. 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  Will reach out to primary cardiologist regarding findings.   Electronically Signed By: Stanly Leavens MD On: 02/22/2020 15:50  Narrative EXAM: OVER-READ INTERPRETATION  CT CHEST  The following report is an over-read performed by radiologist Dr. Rockey Kilts of [facility] on 02/22/2020. This over-read does not include interpretation of cardiac or coronary anatomy or pathology. The coronary CTA interpretation by the cardiologist is attached.  COMPARISON:  01/13/2020 chest radiograph  FINDINGS: Vascular: Normal aortic caliber. No central pulmonary embolism, on this non-dedicated study.  Mediastinum/Nodes: No imaged thoracic adenopathy.  Lungs/Pleura: No pleural fluid.  Clear imaged lungs.  Upper Abdomen: Normal imaged portions of the liver, spleen.  Musculoskeletal: No acute osseous abnormality.  IMPRESSION: No acute findings in the imaged extracardiac chest.  Electronically Signed: By: Rockey Kilts M.D. On: 02/22/2020 14:19     ______________________________________________________________________________________________     EKG: Sinus I have independently reviewed the above radiologic studies and discussed with the patient   Recent Lab Findings: Lab Results  Component Value Date   WBC 8.0 02/23/2024   HGB 13.3 02/27/2024   HGB 13.3 02/27/2024   HCT 39.0 02/27/2024   HCT 39.0 02/27/2024   PLT 229 02/23/2024   GLUCOSE 98 02/23/2024   ALT 19 01/25/2018   AST 20 01/25/2018   NA 141 02/27/2024   NA 141 02/27/2024   K 4.2 02/27/2024   K 4.3 02/27/2024   CL 103 02/23/2024   CREATININE 0.85 02/23/2024   BUN 15 02/23/2024   CO2 22 02/23/2024      Assessment / Plan:   43 y.o. male with severe aortic valve regurgitation secondary to a bicuspid valve.  He also has an ascending aneurysm of 4 cm.  We discussed the risks and benefits of a mechanical aortic valve replacement.  He is agreeable to proceed.  He will  need dental clearance.     I  spent 40 minutes counseling the patient face to face.   Linnie MALVA Rayas 03/05/2024 11:12 AM           [1]  Social History Tobacco Use  Smoking Status Former   Current packs/day: 0.00   Average packs/day: 0.3 packs/day for 1  year (0.3 ttl pk-yrs)   Types: Cigarettes   Start date: 2014   Quit date: 2015   Years since quitting: 10.9  Smokeless Tobacco Never  [2] No Known Allergies

## 2024-03-10 NOTE — Pre-Procedure Instructions (Signed)
 Surgical Instructions   Your procedure is scheduled on March 15, 2024. Report to [facility] Main Entrance A at 10:00 A.M., then check in with the Admitting office. Any questions or running late day of surgery: call [phone]  Questions prior to your surgery date: call [phone], Monday-Friday, 8am-4pm. If you experience any cold or flu symptoms such as cough, fever, chills, shortness of breath, etc. between now and your scheduled surgery, please notify us  at the above number.     Remember:  Do not eat or drink after midnight the night before your surgery    Take these medicines the morning of surgery with A SIP OF WATER : NONE   One week prior to surgery, STOP taking any Aspirin  (unless otherwise instructed by your surgeon) Aleve, Naproxen, Ibuprofen, Motrin, Advil, Goody's, BC's, all herbal medications, fish oil, and non-prescription vitamins.                     Do NOT Smoke (Tobacco/Vaping) for 24 hours prior to your procedure.  If you use a CPAP at night, you may bring your mask/headgear for your overnight stay.   You will be asked to remove any contacts, glasses, piercing's, hearing aid's, dentures/partials prior to surgery. Please bring cases for these items if needed.    Patients discharged the day of surgery will not be allowed to drive home, and someone needs to stay with them for 24 hours.  SURGICAL WAITING ROOM VISITATION Patients may have no more than 2 support people in the waiting area - these visitors may rotate.   Pre-op nurse will coordinate an appropriate time for 1 ADULT support person, who may not rotate, to accompany patient in pre-op.  Children under the age of 69 must have an adult with them who is not the patient and must remain in the main waiting area with an adult.  If the patient needs to stay at the hospital during part of their recovery, the visitor guidelines for inpatient rooms apply.  Please refer to the [facility] website for the  visitor guidelines for any additional information.   If you received a COVID test during your pre-op visit  it is requested that you wear a mask when out in public, stay away from anyone that may not be feeling well and notify your surgeon if you develop symptoms. If you have been in contact with anyone that has tested positive in the last 10 days please notify you surgeon.      Pre-operative CHG Bathing Instructions   You can play a key role in reducing the risk of infection after surgery. Your skin needs to be as free of germs as possible. You can reduce the number of germs on your skin by washing with CHG (chlorhexidine gluconate) soap before surgery. CHG is an antiseptic soap that kills germs and continues to kill germs even after washing.   DO NOT use if you have an allergy to chlorhexidine/CHG or antibacterial soaps. If your skin becomes reddened or irritated, stop using the CHG and notify one of our RNs at [phone].              TAKE A SHOWER THE NIGHT BEFORE SURGERY   Please keep in mind the following:  DO NOT shave, including legs and underarms, 48 hours prior to surgery.   You may shave your face before/day of surgery.  Place clean sheets on your bed the night before surgery Use a clean washcloth (not used since being washed)  for shower. DO NOT sleep with pet's night before surgery.  CHG Shower Instructions:  Wash your face and private area with normal soap. If you choose to wash your hair, wash first with your normal shampoo.  After you use shampoo/soap, rinse your hair and body thoroughly to remove shampoo/soap residue.  Turn the water  OFF and apply half the bottle of CHG soap to a CLEAN washcloth.  Apply CHG soap ONLY FROM YOUR NECK DOWN TO YOUR TOES (washing for 3-5 minutes)  DO NOT use CHG soap on face, private areas, open wounds, or sores.  Pay special attention to the area where your surgery is being performed.  If you are having back surgery, having someone wash  your back for you may be helpful. Wait 2 minutes after CHG soap is applied, then you may rinse off the CHG soap.  Pat dry with a clean towel  Put on clean pajamas    Additional instructions for the day of surgery: If you choose, you may shower the morning of surgery with an antibacterial soap.  DO NOT APPLY any lotions, deodorants, cologne, or perfumes.   Do not wear jewelry or makeup Do not wear nail polish, gel polish, artificial nails, or any other type of covering on natural nails (fingers and toes) Do not bring valuables to the hospital. [facility] is not responsible for valuables/personal belongings. Put on clean/comfortable clothes.  Please brush your teeth.  Ask your nurse before applying any prescription medications to the skin.

## 2024-03-11 ENCOUNTER — Other Ambulatory Visit: Payer: Self-pay

## 2024-03-11 ENCOUNTER — Encounter (HOSPITAL_COMMUNITY): Payer: Self-pay

## 2024-03-11 ENCOUNTER — Telehealth: Payer: Self-pay | Admitting: Cardiology

## 2024-03-11 ENCOUNTER — Inpatient Hospital Stay (HOSPITAL_COMMUNITY)
Admission: RE | Admit: 2024-03-11 | Discharge: 2024-03-11 | Attending: Thoracic Surgery (Cardiothoracic Vascular Surgery)

## 2024-03-11 ENCOUNTER — Ambulatory Visit (HOSPITAL_COMMUNITY): Admission: RE | Admit: 2024-03-11 | Source: Ambulatory Visit

## 2024-03-11 VITALS — BP 157/65 | HR 68 | Temp 97.9°F | Resp 18 | Ht 72.0 in | Wt 261.0 lb

## 2024-03-11 DIAGNOSIS — Z01818 Encounter for other preprocedural examination: Secondary | ICD-10-CM | POA: Diagnosis present

## 2024-03-11 DIAGNOSIS — I351 Nonrheumatic aortic (valve) insufficiency: Secondary | ICD-10-CM | POA: Diagnosis present

## 2024-03-11 HISTORY — DX: Cardiac murmur, unspecified: R01.1

## 2024-03-11 LAB — URINALYSIS, ROUTINE W REFLEX MICROSCOPIC
Bacteria, UA: NONE SEEN
Bilirubin Urine: NEGATIVE
Glucose, UA: NEGATIVE mg/dL
Hgb urine dipstick: NEGATIVE
Ketones, ur: NEGATIVE mg/dL
Leukocytes,Ua: NEGATIVE
Nitrite: NEGATIVE
Protein, ur: 30 mg/dL — AB
Specific Gravity, Urine: 1.02 (ref 1.005–1.030)
pH: 5 (ref 5.0–8.0)

## 2024-03-11 LAB — TYPE AND SCREEN
ABO/RH(D): A POS
Antibody Screen: NEGATIVE

## 2024-03-11 LAB — PROTIME-INR
INR: 1 (ref 0.8–1.2)
Prothrombin Time: 13.7 s (ref 11.4–15.2)

## 2024-03-11 LAB — CBC
HCT: 45.8 % (ref 39.0–52.0)
Hemoglobin: 15 g/dL (ref 13.0–17.0)
MCH: 26.2 pg (ref 26.0–34.0)
MCHC: 32.8 g/dL (ref 30.0–36.0)
MCV: 79.9 fL — ABNORMAL LOW (ref 80.0–100.0)
Platelets: 207 K/uL (ref 150–400)
RBC: 5.73 MIL/uL (ref 4.22–5.81)
RDW: 13.2 % (ref 11.5–15.5)
WBC: 6.9 K/uL (ref 4.0–10.5)
nRBC: 0 % (ref 0.0–0.2)

## 2024-03-11 LAB — COMPREHENSIVE METABOLIC PANEL WITH GFR
ALT: 15 U/L (ref 0–44)
AST: 20 U/L (ref 15–41)
Albumin: 4.4 g/dL (ref 3.5–5.0)
Alkaline Phosphatase: 75 U/L (ref 38–126)
Anion gap: 8 (ref 5–15)
BUN: 19 mg/dL (ref 6–20)
CO2: 25 mmol/L (ref 22–32)
Calcium: 9.4 mg/dL (ref 8.9–10.3)
Chloride: 106 mmol/L (ref 98–111)
Creatinine, Ser: 0.83 mg/dL (ref 0.61–1.24)
GFR, Estimated: >60 mL/min (ref >60)
Glucose, Bld: 122 mg/dL — ABNORMAL HIGH (ref 70–99)
Potassium: 4.5 mmol/L (ref 3.5–5.1)
Sodium: 139 mmol/L (ref 135–145)
Total Bilirubin: 0.5 mg/dL (ref 0.0–1.2)
Total Protein: 7.6 g/dL (ref 6.5–8.1)

## 2024-03-11 LAB — SURGICAL PCR SCREEN
MRSA, PCR: NEGATIVE
Staphylococcus aureus: NEGATIVE

## 2024-03-11 LAB — HEMOGLOBIN A1C
Hgb A1c MFr Bld: 5.4 % (ref 4.8–5.6)
Mean Plasma Glucose: 108.28 mg/dL

## 2024-03-11 LAB — APTT: aPTT: 32 s (ref 24–36)

## 2024-03-11 NOTE — Telephone Encounter (Signed)
 Patient stated he has surgery on Monday (12/22) and wants to know if he still needs to keep appointment tomorrow (12/19).

## 2024-03-11 NOTE — Progress Notes (Signed)
 PCP - Dr. Emery Fuss Cardiologist - Dr. Newman Lawrence - last office visit 02/23/2024  PPM/ICD - Denies Device Orders - n/a Rep Notified - n/a  Chest x-ray - 03/11/2024 EKG - 02/23/2024 Stress Test - Denies ECHO - 02/27/2024 Cardiac Cath - 02/27/2024  Sleep Study - Denies CPAP - n/a  No DM  Last dose of GLP1 agonist- n/a GLP1 instructions: n/a  Blood Thinner Instructions: n/a Aspirin  Instructions: n/a  NPO after midnight  COVID TEST- n/a   Anesthesia review: No   Patient denies shortness of breath, fever, cough and chest pain at PAT appointment. Pt denies any respiratory illness/infection in the last two months.    All instructions explained to the patient, with a verbal understanding of the material. Patient agrees to go over the instructions while at home for a better understanding. Patient also instructed to self quarantine after being tested for COVID-19. The opportunity to ask questions was provided.

## 2024-03-11 NOTE — Telephone Encounter (Signed)
 Spoke with patient and informed him appt scheduled for tomorrow is a post-cath follow-up. Recommend he keep appt, patient verbalized understanding.

## 2024-03-12 ENCOUNTER — Ambulatory Visit: Admitting: Nurse Practitioner

## 2024-03-12 MED ORDER — PHENYLEPHRINE HCL-NACL 20-0.9 MG/250ML-% IV SOLN
30.0000 ug/min | INTRAVENOUS | Status: AC
  Filled 2024-03-12: qty 250

## 2024-03-12 MED ORDER — HEPARIN 30,000 UNITS/1000 ML (OHS) CELLSAVER SOLUTION
Status: AC
  Filled 2024-03-12: qty 1000

## 2024-03-12 MED ORDER — EPINEPHRINE HCL 5 MG/250ML IV SOLN IN NS
0.0000 ug/min | INTRAVENOUS | Status: AC
  Filled 2024-03-12: qty 250

## 2024-03-12 MED ORDER — CEFAZOLIN SODIUM-DEXTROSE 2-4 GM/100ML-% IV SOLN
2.0000 g | INTRAVENOUS | Status: AC
  Filled 2024-03-12: qty 100

## 2024-03-12 MED ORDER — VANCOMYCIN HCL 1.5 G IV SOLR
1500.0000 mg | INTRAVENOUS | Status: AC
  Filled 2024-03-12: qty 30

## 2024-03-12 MED ORDER — TRANEXAMIC ACID (OHS) BOLUS VIA INFUSION
15.0000 mg/kg | INTRAVENOUS | Status: AC
  Filled 2024-03-12: qty 1776

## 2024-03-12 MED ORDER — POTASSIUM CHLORIDE 2 MEQ/ML IV SOLN
80.0000 meq | INTRAVENOUS | Status: AC
  Filled 2024-03-12: qty 40

## 2024-03-12 MED ORDER — MILRINONE LACTATE IN DEXTROSE 20-5 MG/100ML-% IV SOLN
0.3000 ug/kg/min | INTRAVENOUS | Status: AC
  Filled 2024-03-12: qty 100

## 2024-03-12 MED ORDER — TRANEXAMIC ACID 1000 MG/10ML IV SOLN
1.5000 mg/kg/h | INTRAVENOUS | Status: AC
  Filled 2024-03-12: qty 25

## 2024-03-12 MED ORDER — NITROGLYCERIN IN D5W 200-5 MCG/ML-% IV SOLN
2.0000 ug/min | INTRAVENOUS | Status: AC
  Filled 2024-03-12: qty 250

## 2024-03-12 MED ORDER — INSULIN REGULAR(HUMAN) IN NACL 100-0.9 UT/100ML-% IV SOLN
INTRAVENOUS | Status: AC
  Filled 2024-03-12: qty 100

## 2024-03-12 MED ORDER — TRANEXAMIC ACID (OHS) PUMP PRIME SOLUTION
2.0000 mg/kg | INTRAVENOUS | Status: AC
  Filled 2024-03-12: qty 2.37

## 2024-03-12 MED ORDER — PLASMA-LYTE A IV SOLN
INTRAVENOUS | Status: AC
  Filled 2024-03-12: qty 2.5

## 2024-03-12 MED ORDER — NOREPINEPHRINE 4 MG/250ML-% IV SOLN
0.0000 ug/min | INTRAVENOUS | Status: AC
  Filled 2024-03-12: qty 250

## 2024-03-12 MED ORDER — MANNITOL 20 % IV SOLN
INTRAVENOUS | Status: AC
  Filled 2024-03-12: qty 13

## 2024-03-12 MED ORDER — DEXMEDETOMIDINE HCL IN NACL 400 MCG/100ML IV SOLN
0.1000 ug/kg/h | INTRAVENOUS | Status: AC
  Filled 2024-03-12: qty 100

## 2024-03-14 MED ORDER — POTASSIUM CHLORIDE 2 MEQ/ML IV SOLN
80.0000 meq | INTRAVENOUS | Status: DC
  Filled 2024-03-14: qty 40

## 2024-03-14 MED ORDER — TRANEXAMIC ACID (OHS) BOLUS VIA INFUSION
15.0000 mg/kg | INTRAVENOUS | Status: AC
  Administered 2024-03-15: 1776 mg via INTRAVENOUS
  Filled 2024-03-14: qty 1776

## 2024-03-14 MED ORDER — NOREPINEPHRINE 4 MG/250ML-% IV SOLN
0.0000 ug/min | INTRAVENOUS | Status: DC
  Filled 2024-03-14: qty 250

## 2024-03-14 MED ORDER — INSULIN REGULAR(HUMAN) IN NACL 100-0.9 UT/100ML-% IV SOLN: INTRAVENOUS | Status: DC

## 2024-03-14 MED ORDER — CEFAZOLIN SODIUM-DEXTROSE 2-4 GM/100ML-% IV SOLN
2.0000 g | INTRAVENOUS | Status: AC
  Administered 2024-03-15 (×2): 2 g via INTRAVENOUS

## 2024-03-14 MED ORDER — TRANEXAMIC ACID (OHS) PUMP PRIME SOLUTION
2.0000 mg/kg | INTRAVENOUS | Status: DC
  Filled 2024-03-14: qty 2.37

## 2024-03-14 MED ORDER — VANCOMYCIN HCL 1.5 G IV SOLR
1500.0000 mg | INTRAVENOUS | Status: AC
  Administered 2024-03-15: 1500 mg via INTRAVENOUS
  Filled 2024-03-14: qty 30

## 2024-03-14 MED ORDER — TRANEXAMIC ACID 1000 MG/10ML IV SOLN
1.5000 mg/kg/h | INTRAVENOUS | Status: AC
  Administered 2024-03-15: 1.5 mg/kg/h via INTRAVENOUS
  Filled 2024-03-14: qty 25

## 2024-03-14 MED ORDER — CEFAZOLIN SODIUM-DEXTROSE 2-4 GM/100ML-% IV SOLN: 2.0000 g | INTRAVENOUS | Status: DC

## 2024-03-14 MED ORDER — MANNITOL 20 % IV SOLN
INTRAVENOUS | Status: DC
  Filled 2024-03-14: qty 13

## 2024-03-14 MED ORDER — EPINEPHRINE HCL 5 MG/250ML IV SOLN IN NS
0.0000 ug/min | INTRAVENOUS | Status: DC
  Filled 2024-03-14: qty 250

## 2024-03-14 MED ORDER — HEPARIN 30,000 UNITS/1000 ML (OHS) CELLSAVER SOLUTION
Status: DC
  Filled 2024-03-14: qty 1000

## 2024-03-14 MED ORDER — DEXMEDETOMIDINE HCL IN NACL 400 MCG/100ML IV SOLN
0.1000 ug/kg/h | INTRAVENOUS | Status: AC
  Administered 2024-03-15: .5 ug/kg/h via INTRAVENOUS
  Filled 2024-03-14: qty 100

## 2024-03-14 MED ORDER — PHENYLEPHRINE HCL-NACL 20-0.9 MG/250ML-% IV SOLN
30.0000 ug/min | INTRAVENOUS | Status: AC
  Administered 2024-03-15: 30 ug/min via INTRAVENOUS
  Filled 2024-03-14: qty 250

## 2024-03-14 MED ORDER — NITROGLYCERIN IN D5W 200-5 MCG/ML-% IV SOLN
2.0000 ug/min | INTRAVENOUS | Status: DC
  Filled 2024-03-14: qty 250

## 2024-03-14 MED ORDER — PLASMA-LYTE A IV SOLN
INTRAVENOUS | Status: DC
  Filled 2024-03-14: qty 2.5

## 2024-03-14 MED ORDER — MILRINONE LACTATE IN DEXTROSE 20-5 MG/100ML-% IV SOLN
0.3000 ug/kg/min | INTRAVENOUS | Status: DC
  Filled 2024-03-14: qty 100

## 2024-03-15 ENCOUNTER — Other Ambulatory Visit: Payer: Self-pay

## 2024-03-15 ENCOUNTER — Inpatient Hospital Stay (HOSPITAL_COMMUNITY)

## 2024-03-15 ENCOUNTER — Inpatient Hospital Stay (HOSPITAL_COMMUNITY): Admitting: Certified Registered Nurse Anesthetist

## 2024-03-15 ENCOUNTER — Inpatient Hospital Stay (HOSPITAL_COMMUNITY)
Admission: RE | Disposition: A | Discharge status: Home / Self Care | Payer: Self-pay | Source: Home / Self Care | Attending: Thoracic Surgery (Cardiothoracic Vascular Surgery)

## 2024-03-15 ENCOUNTER — Encounter (HOSPITAL_COMMUNITY): Payer: Self-pay | Admitting: Thoracic Surgery (Cardiothoracic Vascular Surgery)

## 2024-03-15 ENCOUNTER — Inpatient Hospital Stay (HOSPITAL_COMMUNITY)
Admission: RE | Admit: 2024-03-15 | Discharge: 2024-03-20 | DRG: 220 | Disposition: A | Discharge status: Home / Self Care | Attending: Thoracic Surgery (Cardiothoracic Vascular Surgery) | Admitting: Thoracic Surgery (Cardiothoracic Vascular Surgery)

## 2024-03-15 DIAGNOSIS — I352 Nonrheumatic aortic (valve) stenosis with insufficiency: Secondary | ICD-10-CM | POA: Diagnosis present

## 2024-03-15 DIAGNOSIS — R0989 Other specified symptoms and signs involving the circulatory and respiratory systems: Secondary | ICD-10-CM | POA: Diagnosis present

## 2024-03-15 DIAGNOSIS — J9811 Atelectasis: Secondary | ICD-10-CM | POA: Diagnosis not present

## 2024-03-15 DIAGNOSIS — I351 Nonrheumatic aortic (valve) insufficiency: Secondary | ICD-10-CM

## 2024-03-15 DIAGNOSIS — Z79899 Other long term (current) drug therapy: Secondary | ICD-10-CM | POA: Diagnosis not present

## 2024-03-15 DIAGNOSIS — I2510 Atherosclerotic heart disease of native coronary artery without angina pectoris: Secondary | ICD-10-CM | POA: Diagnosis present

## 2024-03-15 DIAGNOSIS — E878 Other disorders of electrolyte and fluid balance, not elsewhere classified: Secondary | ICD-10-CM | POA: Diagnosis present

## 2024-03-15 DIAGNOSIS — Z952 Presence of prosthetic heart valve: Secondary | ICD-10-CM | POA: Diagnosis not present

## 2024-03-15 DIAGNOSIS — A630 Anogenital (venereal) warts: Secondary | ICD-10-CM | POA: Diagnosis present

## 2024-03-15 DIAGNOSIS — R000 Tachycardia, unspecified: Secondary | ICD-10-CM | POA: Diagnosis present

## 2024-03-15 DIAGNOSIS — D62 Acute posthemorrhagic anemia: Secondary | ICD-10-CM | POA: Diagnosis present

## 2024-03-15 DIAGNOSIS — Z833 Family history of diabetes mellitus: Secondary | ICD-10-CM | POA: Diagnosis not present

## 2024-03-15 DIAGNOSIS — Z87891 Personal history of nicotine dependence: Secondary | ICD-10-CM

## 2024-03-15 DIAGNOSIS — Q2381 Bicuspid aortic valve: Secondary | ICD-10-CM | POA: Diagnosis not present

## 2024-03-15 DIAGNOSIS — I10 Essential (primary) hypertension: Secondary | ICD-10-CM | POA: Diagnosis present

## 2024-03-15 DIAGNOSIS — Z954 Presence of other heart-valve replacement: Principal | ICD-10-CM

## 2024-03-15 DIAGNOSIS — E785 Hyperlipidemia, unspecified: Secondary | ICD-10-CM | POA: Diagnosis present

## 2024-03-15 DIAGNOSIS — D72829 Elevated white blood cell count, unspecified: Secondary | ICD-10-CM | POA: Diagnosis present

## 2024-03-15 DIAGNOSIS — Z8249 Family history of ischemic heart disease and other diseases of the circulatory system: Secondary | ICD-10-CM | POA: Diagnosis not present

## 2024-03-15 DIAGNOSIS — E782 Mixed hyperlipidemia: Secondary | ICD-10-CM

## 2024-03-15 DIAGNOSIS — I7121 Aneurysm of the ascending aorta, without rupture: Secondary | ICD-10-CM | POA: Diagnosis not present

## 2024-03-15 DIAGNOSIS — Z9049 Acquired absence of other specified parts of digestive tract: Secondary | ICD-10-CM | POA: Diagnosis not present

## 2024-03-15 DIAGNOSIS — M7989 Other specified soft tissue disorders: Secondary | ICD-10-CM | POA: Diagnosis present

## 2024-03-15 HISTORY — PX: AORTIC VALVE REPLACEMENT: SHX41

## 2024-03-15 HISTORY — PX: INTRAOPERATIVE TRANSESOPHAGEAL ECHOCARDIOGRAM: SHX5062

## 2024-03-15 LAB — BASIC METABOLIC PANEL WITH GFR
Anion gap: 9 (ref 5–15)
BUN: 17 mg/dL (ref 6–20)
CO2: 22 mmol/L (ref 22–32)
Calcium: 8.1 mg/dL — ABNORMAL LOW (ref 8.9–10.3)
Chloride: 106 mmol/L (ref 98–111)
Creatinine, Ser: 0.96 mg/dL (ref 0.61–1.24)
GFR, Estimated: >60 mL/min
Glucose, Bld: 149 mg/dL — ABNORMAL HIGH (ref 70–99)
Potassium: 4.4 mmol/L (ref 3.5–5.1)
Sodium: 138 mmol/L (ref 135–145)

## 2024-03-15 LAB — POCT I-STAT 7, (LYTES, BLD GAS, ICA,H+H)
Acid-Base Excess: 0 mmol/L (ref 0.0–2.0)
Acid-base deficit: 3 mmol/L — ABNORMAL HIGH (ref 0.0–2.0)
Acid-base deficit: 1 mmol/L (ref 0.0–2.0)
Acid-base deficit: 1 mmol/L (ref 0.0–2.0)
Acid-base deficit: 2 mmol/L (ref 0.0–2.0)
Acid-base deficit: 7 mmol/L — ABNORMAL HIGH (ref 0.0–2.0)
Acid-base deficit: 3 mmol/L — ABNORMAL HIGH (ref 0.0–2.0)
Acid-base deficit: 7 mmol/L — ABNORMAL HIGH (ref 0.0–2.0)
Bicarbonate: 22.4 mmol/L (ref 20.0–28.0)
Bicarbonate: 24 mmol/L (ref 20.0–28.0)
Bicarbonate: 23.3 mmol/L (ref 20.0–28.0)
Bicarbonate: 24.6 mmol/L (ref 20.0–28.0)
Bicarbonate: 22.6 mmol/L (ref 20.0–28.0)
Bicarbonate: 18.4 mmol/L — ABNORMAL LOW (ref 20.0–28.0)
Bicarbonate: 22.5 mmol/L (ref 20.0–28.0)
Bicarbonate: 17.7 mmol/L — ABNORMAL LOW (ref 20.0–28.0)
Calcium, Ion: 1.2 mmol/L (ref 1.15–1.40)
Calcium, Ion: 1 mmol/L — ABNORMAL LOW (ref 1.15–1.40)
Calcium, Ion: 1.08 mmol/L — ABNORMAL LOW (ref 1.15–1.40)
Calcium, Ion: 1.28 mmol/L (ref 1.15–1.40)
Calcium, Ion: 1.23 mmol/L (ref 1.15–1.40)
Calcium, Ion: 1.11 mmol/L — ABNORMAL LOW (ref 1.15–1.40)
Calcium, Ion: 1.2 mmol/L (ref 1.15–1.40)
Calcium, Ion: 1 mmol/L — ABNORMAL LOW (ref 1.15–1.40)
HCT: 38 % — ABNORMAL LOW (ref 39.0–52.0)
HCT: 31 % — ABNORMAL LOW (ref 39.0–52.0)
HCT: 32 % — ABNORMAL LOW (ref 39.0–52.0)
HCT: 33 % — ABNORMAL LOW (ref 39.0–52.0)
HCT: 41 % (ref 39.0–52.0)
HCT: 35 % — ABNORMAL LOW (ref 39.0–52.0)
HCT: 39 % (ref 39.0–52.0)
HCT: 32 % — ABNORMAL LOW (ref 39.0–52.0)
Hemoglobin: 12.9 g/dL — ABNORMAL LOW (ref 13.0–17.0)
Hemoglobin: 10.5 g/dL — ABNORMAL LOW (ref 13.0–17.0)
Hemoglobin: 10.9 g/dL — ABNORMAL LOW (ref 13.0–17.0)
Hemoglobin: 11.2 g/dL — ABNORMAL LOW (ref 13.0–17.0)
Hemoglobin: 13.9 g/dL (ref 13.0–17.0)
Hemoglobin: 11.9 g/dL — ABNORMAL LOW (ref 13.0–17.0)
Hemoglobin: 13.3 g/dL (ref 13.0–17.0)
Hemoglobin: 10.9 g/dL — ABNORMAL LOW (ref 13.0–17.0)
O2 Saturation: 99 %
O2 Saturation: 100 %
O2 Saturation: 100 %
O2 Saturation: 100 %
O2 Saturation: 96 %
O2 Saturation: 92 %
O2 Saturation: 97 %
O2 Saturation: 96 %
Patient temperature: 37.4
Patient temperature: 38
Patient temperature: 39
Patient temperature: 38.8
Potassium: 4.1 mmol/L (ref 3.5–5.1)
Potassium: 4.2 mmol/L (ref 3.5–5.1)
Potassium: 5 mmol/L (ref 3.5–5.1)
Potassium: 4.8 mmol/L (ref 3.5–5.1)
Potassium: 4.5 mmol/L (ref 3.5–5.1)
Potassium: 4.1 mmol/L (ref 3.5–5.1)
Potassium: 4.6 mmol/L (ref 3.5–5.1)
Potassium: 3.4 mmol/L — ABNORMAL LOW (ref 3.5–5.1)
Sodium: 138 mmol/L (ref 135–145)
Sodium: 138 mmol/L (ref 135–145)
Sodium: 135 mmol/L (ref 135–145)
Sodium: 135 mmol/L (ref 135–145)
Sodium: 138 mmol/L (ref 135–145)
Sodium: 143 mmol/L (ref 135–145)
Sodium: 140 mmol/L (ref 135–145)
Sodium: 145 mmol/L (ref 135–145)
TCO2: 24 mmol/L (ref 22–32)
TCO2: 25 mmol/L (ref 22–32)
TCO2: 24 mmol/L (ref 22–32)
TCO2: 26 mmol/L (ref 22–32)
TCO2: 24 mmol/L (ref 22–32)
TCO2: 19 mmol/L — ABNORMAL LOW (ref 22–32)
TCO2: 24 mmol/L (ref 22–32)
TCO2: 19 mmol/L — ABNORMAL LOW (ref 22–32)
pCO2 arterial: 39.7 mmHg (ref 32–48)
pCO2 arterial: 37.5 mmHg (ref 32–48)
pCO2 arterial: 35.5 mmHg (ref 32–48)
pCO2 arterial: 43.1 mmHg (ref 32–48)
pCO2 arterial: 39.7 mmHg (ref 32–48)
pCO2 arterial: 36.3 mmHg (ref 32–48)
pCO2 arterial: 43.6 mmHg (ref 32–48)
pCO2 arterial: 35.2 mmHg (ref 32–48)
pH, Arterial: 7.361 (ref 7.35–7.45)
pH, Arterial: 7.415 (ref 7.35–7.45)
pH, Arterial: 7.425 (ref 7.35–7.45)
pH, Arterial: 7.364 (ref 7.35–7.45)
pH, Arterial: 7.365 (ref 7.35–7.45)
pH, Arterial: 7.319 — ABNORMAL LOW (ref 7.35–7.45)
pH, Arterial: 7.329 — ABNORMAL LOW (ref 7.35–7.45)
pH, Arterial: 7.318 — ABNORMAL LOW (ref 7.35–7.45)
pO2, Arterial: 155 mmHg — ABNORMAL HIGH (ref 83–108)
pO2, Arterial: 293 mmHg — ABNORMAL HIGH (ref 83–108)
pO2, Arterial: 321 mmHg — ABNORMAL HIGH (ref 83–108)
pO2, Arterial: 341 mmHg — ABNORMAL HIGH (ref 83–108)
pO2, Arterial: 90 mmHg (ref 83–108)
pO2, Arterial: 73 mmHg — ABNORMAL LOW (ref 83–108)
pO2, Arterial: 102 mmHg (ref 83–108)
pO2, Arterial: 100 mmHg (ref 83–108)

## 2024-03-15 LAB — CBC
HCT: 43.1 % (ref 39.0–52.0)
HCT: 40.5 % (ref 39.0–52.0)
Hemoglobin: 14.2 g/dL (ref 13.0–17.0)
Hemoglobin: 13.3 g/dL (ref 13.0–17.0)
MCH: 26.1 pg (ref 26.0–34.0)
MCH: 26.1 pg (ref 26.0–34.0)
MCHC: 32.9 g/dL (ref 30.0–36.0)
MCHC: 32.8 g/dL (ref 30.0–36.0)
MCV: 79.2 fL — ABNORMAL LOW (ref 80.0–100.0)
MCV: 79.4 fL — ABNORMAL LOW (ref 80.0–100.0)
Platelets: 172 K/uL (ref 150–400)
Platelets: 199 K/uL (ref 150–400)
RBC: 5.44 MIL/uL (ref 4.22–5.81)
RBC: 5.1 MIL/uL (ref 4.22–5.81)
RDW: 13.1 % (ref 11.5–15.5)
RDW: 13.1 % (ref 11.5–15.5)
WBC: 14.5 K/uL — ABNORMAL HIGH (ref 4.0–10.5)
WBC: 13.2 K/uL — ABNORMAL HIGH (ref 4.0–10.5)
nRBC: 0 % (ref 0.0–0.2)
nRBC: 0 % (ref 0.0–0.2)

## 2024-03-15 LAB — GLUCOSE, CAPILLARY
Glucose-Capillary: 122 mg/dL — ABNORMAL HIGH (ref 70–99)
Glucose-Capillary: 111 mg/dL — ABNORMAL HIGH (ref 70–99)
Glucose-Capillary: 106 mg/dL — ABNORMAL HIGH (ref 70–99)
Glucose-Capillary: 119 mg/dL — ABNORMAL HIGH (ref 70–99)
Glucose-Capillary: 145 mg/dL — ABNORMAL HIGH (ref 70–99)
Glucose-Capillary: 134 mg/dL — ABNORMAL HIGH (ref 70–99)

## 2024-03-15 LAB — POCT I-STAT, CHEM 8
BUN: 15 mg/dL (ref 6–20)
BUN: 16 mg/dL (ref 6–20)
BUN: 15 mg/dL (ref 6–20)
BUN: 15 mg/dL (ref 6–20)
BUN: 16 mg/dL (ref 6–20)
Calcium, Ion: 1.21 mmol/L (ref 1.15–1.40)
Calcium, Ion: 1.22 mmol/L (ref 1.15–1.40)
Calcium, Ion: 1.09 mmol/L — ABNORMAL LOW (ref 1.15–1.40)
Calcium, Ion: 1.09 mmol/L — ABNORMAL LOW (ref 1.15–1.40)
Calcium, Ion: 1.33 mmol/L (ref 1.15–1.40)
Chloride: 104 mmol/L (ref 98–111)
Chloride: 104 mmol/L (ref 98–111)
Chloride: 104 mmol/L (ref 98–111)
Chloride: 101 mmol/L (ref 98–111)
Chloride: 103 mmol/L (ref 98–111)
Creatinine, Ser: 0.7 mg/dL (ref 0.61–1.24)
Creatinine, Ser: 0.8 mg/dL (ref 0.61–1.24)
Creatinine, Ser: 0.7 mg/dL (ref 0.61–1.24)
Creatinine, Ser: 0.8 mg/dL (ref 0.61–1.24)
Creatinine, Ser: 0.8 mg/dL (ref 0.61–1.24)
Glucose, Bld: 117 mg/dL — ABNORMAL HIGH (ref 70–99)
Glucose, Bld: 98 mg/dL (ref 70–99)
Glucose, Bld: 104 mg/dL — ABNORMAL HIGH (ref 70–99)
Glucose, Bld: 130 mg/dL — ABNORMAL HIGH (ref 70–99)
Glucose, Bld: 121 mg/dL — ABNORMAL HIGH (ref 70–99)
HCT: 37 % — ABNORMAL LOW (ref 39.0–52.0)
HCT: 39 % (ref 39.0–52.0)
HCT: 33 % — ABNORMAL LOW (ref 39.0–52.0)
HCT: 31 % — ABNORMAL LOW (ref 39.0–52.0)
HCT: 33 % — ABNORMAL LOW (ref 39.0–52.0)
Hemoglobin: 12.6 g/dL — ABNORMAL LOW (ref 13.0–17.0)
Hemoglobin: 13.3 g/dL (ref 13.0–17.0)
Hemoglobin: 11.2 g/dL — ABNORMAL LOW (ref 13.0–17.0)
Hemoglobin: 10.5 g/dL — ABNORMAL LOW (ref 13.0–17.0)
Hemoglobin: 11.2 g/dL — ABNORMAL LOW (ref 13.0–17.0)
Potassium: 4.1 mmol/L (ref 3.5–5.1)
Potassium: 4.3 mmol/L (ref 3.5–5.1)
Potassium: 5 mmol/L (ref 3.5–5.1)
Potassium: 5.1 mmol/L (ref 3.5–5.1)
Potassium: 4.8 mmol/L (ref 3.5–5.1)
Sodium: 138 mmol/L (ref 135–145)
Sodium: 139 mmol/L (ref 135–145)
Sodium: 139 mmol/L (ref 135–145)
Sodium: 135 mmol/L (ref 135–145)
Sodium: 138 mmol/L (ref 135–145)
TCO2: 21 mmol/L — ABNORMAL LOW (ref 22–32)
TCO2: 24 mmol/L (ref 22–32)
TCO2: 25 mmol/L (ref 22–32)
TCO2: 24 mmol/L (ref 22–32)
TCO2: 25 mmol/L (ref 22–32)

## 2024-03-15 LAB — ABO/RH: ABO/RH(D): A POS

## 2024-03-15 LAB — POCT I-STAT EG7
Acid-Base Excess: 0 mmol/L (ref 0.0–2.0)
Bicarbonate: 25.3 mmol/L (ref 20.0–28.0)
Calcium, Ion: 1.05 mmol/L — ABNORMAL LOW (ref 1.15–1.40)
HCT: 33 % — ABNORMAL LOW (ref 39.0–52.0)
Hemoglobin: 11.2 g/dL — ABNORMAL LOW (ref 13.0–17.0)
O2 Saturation: 74 %
Potassium: 4 mmol/L (ref 3.5–5.1)
Sodium: 139 mmol/L (ref 135–145)
TCO2: 27 mmol/L (ref 22–32)
pCO2, Ven: 44 mmHg (ref 44–60)
pH, Ven: 7.368 (ref 7.25–7.43)
pO2, Ven: 41 mmHg (ref 32–45)

## 2024-03-15 LAB — ECHO INTRAOPERATIVE TEE
AR max vel: 3.08 cm2
AV Area VTI: 3.24 cm2
AV Area mean vel: 2.8 cm2
AV Mean grad: 16 mmHg
AV Peak grad: 21.1 mmHg
Ao pk vel: 2.3 m/s
Height: 72 in
P 1/2 time: 366 ms
Weight: 4192 [oz_av]

## 2024-03-15 LAB — PROTIME-INR
INR: 1.3 — ABNORMAL HIGH (ref 0.8–1.2)
Prothrombin Time: 17.2 s — ABNORMAL HIGH (ref 11.4–15.2)

## 2024-03-15 LAB — APTT: aPTT: 37 s — ABNORMAL HIGH (ref 24–36)

## 2024-03-15 LAB — PLATELET COUNT: Platelets: 165 K/uL (ref 150–400)

## 2024-03-15 LAB — HEMOGLOBIN AND HEMATOCRIT, BLOOD
HCT: 32.7 % — ABNORMAL LOW (ref 39.0–52.0)
Hemoglobin: 10.9 g/dL — ABNORMAL LOW (ref 13.0–17.0)

## 2024-03-15 LAB — MAGNESIUM: Magnesium: 3 mg/dL — ABNORMAL HIGH (ref 1.7–2.4)

## 2024-03-15 SURGERY — REPLACEMENT, AORTIC VALVE, OPEN
Anesthesia: General | Site: Chest

## 2024-03-15 MED ORDER — OXYCODONE HCL 5 MG PO TABS
5.0000 mg | ORAL_TABLET | ORAL | Status: DC | PRN
  Administered 2024-03-16: 5 mg via ORAL
  Filled 2024-03-15: qty 1

## 2024-03-15 MED ORDER — METOPROLOL TARTRATE 5 MG/5ML IV SOLN: 2.5000 mg | INTRAVENOUS | Status: DC | PRN

## 2024-03-15 MED ORDER — ~~LOC~~ CARDIAC SURGERY, PATIENT & FAMILY EDUCATION
Freq: Once | Status: DC
  Filled 2024-03-15: qty 1

## 2024-03-15 MED ORDER — FENTANYL CITRATE (PF) 250 MCG/5ML IJ SOLN
INTRAMUSCULAR | Status: AC
  Filled 2024-03-15: qty 5

## 2024-03-15 MED ORDER — CEFAZOLIN SODIUM-DEXTROSE 2-4 GM/100ML-% IV SOLN
2.0000 g | Freq: Three times a day (TID) | INTRAVENOUS | Status: AC
  Administered 2024-03-15 – 2024-03-17 (×6): 2 g via INTRAVENOUS
  Filled 2024-03-15 (×6): qty 100

## 2024-03-15 MED ORDER — SODIUM CHLORIDE 0.9% FLUSH
3.0000 mL | Freq: Two times a day (BID) | INTRAVENOUS | Status: DC
  Administered 2024-03-16 – 2024-03-17 (×3): 3 mL via INTRAVENOUS

## 2024-03-15 MED ORDER — MAGNESIUM SULFATE 4 GM/100ML IV SOLN
4.0000 g | Freq: Once | INTRAVENOUS | Status: AC
  Administered 2024-03-15: 4 g via INTRAVENOUS
  Filled 2024-03-15: qty 100

## 2024-03-15 MED ORDER — ROCURONIUM BROMIDE 10 MG/ML (PF) SYRINGE
PREFILLED_SYRINGE | INTRAVENOUS | Status: DC | PRN
  Administered 2024-03-15 (×2): 50 mg via INTRAVENOUS
  Administered 2024-03-15: 100 mg via INTRAVENOUS

## 2024-03-15 MED ORDER — SODIUM CHLORIDE 0.45 % IV SOLN: INTRAVENOUS | Status: DC | PRN

## 2024-03-15 MED ORDER — INSULIN ASPART 100 UNIT/ML IJ SOLN
0.0000 [IU] | INTRAMUSCULAR | Status: AC
  Administered 2024-03-15 – 2024-03-17 (×7): 2 [IU] via SUBCUTANEOUS
  Filled 2024-03-15 (×6): qty 2

## 2024-03-15 MED ORDER — ACETAMINOPHEN 160 MG/5ML PO SOLN
1000.0000 mg | Freq: Four times a day (QID) | ORAL | Status: DC
  Administered 2024-03-15: 1000 mg
  Filled 2024-03-15: qty 40.6

## 2024-03-15 MED ORDER — METOPROLOL TARTRATE 12.5 MG HALF TABLET
12.5000 mg | ORAL_TABLET | Freq: Two times a day (BID) | ORAL | Status: DC
  Administered 2024-03-16 (×2): 12.5 mg via ORAL
  Filled 2024-03-15 (×2): qty 1

## 2024-03-15 MED ORDER — SODIUM CHLORIDE 0.9 % IV SOLN: INTRAVENOUS | Status: AC

## 2024-03-15 MED ORDER — MIDAZOLAM HCL (PF) 5 MG/ML IJ SOLN
INTRAMUSCULAR | Status: DC | PRN
  Administered 2024-03-15 (×3): 2 mg via INTRAVENOUS

## 2024-03-15 MED ORDER — PLASMA-LYTE A IV SOLN: INTRAVENOUS | Status: DC | PRN

## 2024-03-15 MED ORDER — DEXTROSE 50 % IV SOLN: 0.0000 mL | INTRAVENOUS | Status: DC | PRN

## 2024-03-15 MED ORDER — SODIUM CHLORIDE 0.9 % IV SOLN: 250.0000 mL | INTRAVENOUS | Status: AC

## 2024-03-15 MED ORDER — TRAMADOL HCL 50 MG PO TABS
50.0000 mg | ORAL_TABLET | ORAL | Status: DC | PRN
  Administered 2024-03-15: 100 mg via ORAL
  Filled 2024-03-15: qty 2

## 2024-03-15 MED ORDER — SODIUM CHLORIDE (PF) 0.9 % IJ SOLN: OROMUCOSAL | Status: DC | PRN

## 2024-03-15 MED ORDER — BISACODYL 10 MG RE SUPP: 10.0000 mg | Freq: Every day | RECTAL | Status: DC

## 2024-03-15 MED ORDER — LACTATED RINGERS IV SOLN: INTRAVENOUS | Status: DC | PRN

## 2024-03-15 MED ORDER — CHLORHEXIDINE GLUCONATE 4 % EX SOLN: 30.0000 mL | CUTANEOUS | Status: DC

## 2024-03-15 MED ORDER — CHLORHEXIDINE GLUCONATE CLOTH 2 % EX PADS
6.0000 | MEDICATED_PAD | Freq: Every day | CUTANEOUS | Status: DC
  Administered 2024-03-16: 6 via TOPICAL

## 2024-03-15 MED ORDER — BISACODYL 5 MG PO TBEC
10.0000 mg | DELAYED_RELEASE_TABLET | Freq: Every day | ORAL | Status: DC
  Administered 2024-03-16 – 2024-03-17 (×2): 10 mg via ORAL
  Filled 2024-03-15 (×2): qty 2

## 2024-03-15 MED ORDER — CHLORHEXIDINE GLUCONATE 0.12 % MT SOLN
15.0000 mL | OROMUCOSAL | Status: AC
  Administered 2024-03-15: 15 mL via OROMUCOSAL

## 2024-03-15 MED ORDER — ACETAMINOPHEN 500 MG PO TABS
1000.0000 mg | ORAL_TABLET | Freq: Once | ORAL | Status: AC
  Administered 2024-03-15: 1000 mg via ORAL
  Filled 2024-03-15: qty 2

## 2024-03-15 MED ORDER — ASPIRIN 325 MG PO TBEC
325.0000 mg | DELAYED_RELEASE_TABLET | Freq: Every day | ORAL | Status: DC
  Administered 2024-03-16 – 2024-03-18 (×3): 325 mg via ORAL
  Filled 2024-03-15 (×4): qty 1

## 2024-03-15 MED ORDER — MORPHINE SULFATE (PF) 2 MG/ML IV SOLN
1.0000 mg | INTRAVENOUS | Status: DC | PRN
  Filled 2024-03-15 (×2): qty 1

## 2024-03-15 MED ORDER — MIDAZOLAM HCL (PF) 10 MG/2ML IJ SOLN
INTRAMUSCULAR | Status: AC
  Filled 2024-03-15: qty 2

## 2024-03-15 MED ORDER — ACETAMINOPHEN 160 MG/5ML PO SOLN
650.0000 mg | Freq: Once | ORAL | Status: AC
  Administered 2024-03-15: 650 mg
  Filled 2024-03-15: qty 20.3

## 2024-03-15 MED ORDER — ONDANSETRON HCL 4 MG/2ML IJ SOLN
4.0000 mg | Freq: Four times a day (QID) | INTRAMUSCULAR | Status: DC | PRN
  Administered 2024-03-16 – 2024-03-17 (×2): 4 mg via INTRAVENOUS
  Filled 2024-03-15 (×2): qty 2

## 2024-03-15 MED ORDER — PHENYLEPHRINE HCL-NACL 20-0.9 MG/250ML-% IV SOLN: 0.0000 ug/min | INTRAVENOUS | Status: DC

## 2024-03-15 MED ORDER — MIDAZOLAM HCL (PF) 2 MG/2ML IJ SOLN: 2.0000 mg | INTRAMUSCULAR | Status: DC | PRN

## 2024-03-15 MED ORDER — PROPOFOL 10 MG/ML IV BOLUS
INTRAVENOUS | Status: DC | PRN
  Administered 2024-03-15: 50 mg via INTRAVENOUS
  Administered 2024-03-15: 30 mg via INTRAVENOUS
  Administered 2024-03-15: 50 mg via INTRAVENOUS
  Administered 2024-03-15: 20 mg via INTRAVENOUS

## 2024-03-15 MED ORDER — PROTAMINE SULFATE 10 MG/ML IV SOLN
INTRAVENOUS | Status: DC | PRN
  Administered 2024-03-15: 350 mg via INTRAVENOUS

## 2024-03-15 MED ORDER — VANCOMYCIN HCL IN DEXTROSE 1-5 GM/200ML-% IV SOLN
1000.0000 mg | Freq: Once | INTRAVENOUS | Status: AC
  Administered 2024-03-15: 1000 mg via INTRAVENOUS
  Filled 2024-03-15: qty 200

## 2024-03-15 MED ORDER — PHENYLEPHRINE 80 MCG/ML (10ML) SYRINGE FOR IV PUSH (FOR BLOOD PRESSURE SUPPORT)
PREFILLED_SYRINGE | INTRAVENOUS | Status: DC | PRN
  Administered 2024-03-15: 80 ug via INTRAVENOUS

## 2024-03-15 MED ORDER — FENTANYL CITRATE (PF) 250 MCG/5ML IJ SOLN
INTRAMUSCULAR | Status: DC | PRN
  Administered 2024-03-15 (×5): 250 ug via INTRAVENOUS

## 2024-03-15 MED ORDER — HEPARIN SODIUM (PORCINE) 1000 UNIT/ML IJ SOLN
INTRAMUSCULAR | Status: DC | PRN
  Administered 2024-03-15: 42000 [IU] via INTRAVENOUS

## 2024-03-15 MED ORDER — DOCUSATE SODIUM 100 MG PO CAPS
200.0000 mg | ORAL_CAPSULE | Freq: Every day | ORAL | Status: DC
  Administered 2024-03-16 – 2024-03-20 (×5): 200 mg via ORAL
  Filled 2024-03-15 (×5): qty 2

## 2024-03-15 MED ORDER — INSULIN REGULAR(HUMAN) IN NACL 100-0.9 UT/100ML-% IV SOLN: INTRAVENOUS | Status: DC

## 2024-03-15 MED ORDER — ASPIRIN 81 MG PO CHEW
324.0000 mg | CHEWABLE_TABLET | Freq: Once | ORAL | Status: AC
  Administered 2024-03-15: 324 mg via ORAL
  Filled 2024-03-15: qty 4

## 2024-03-15 MED ORDER — NICARDIPINE HCL IN NACL 20-0.86 MG/200ML-% IV SOLN: 0.0000 mg/h | INTRAVENOUS | Status: DC

## 2024-03-15 MED ORDER — METOPROLOL TARTRATE 12.5 MG HALF TABLET
ORAL_TABLET | ORAL | Status: AC
  Administered 2024-03-15: 12.5 mg via ORAL
  Filled 2024-03-15: qty 1

## 2024-03-15 MED ORDER — METOPROLOL TARTRATE 25 MG/10 ML ORAL SUSPENSION: 12.5000 mg | Freq: Two times a day (BID) | ORAL | Status: DC

## 2024-03-15 MED ORDER — ARTIFICIAL TEARS OPHTHALMIC OINT
TOPICAL_OINTMENT | OPHTHALMIC | Status: DC | PRN
  Administered 2024-03-15: 1 via OPHTHALMIC

## 2024-03-15 MED ORDER — POTASSIUM CHLORIDE 10 MEQ/50ML IV SOLN: 10.0000 meq | INTRAVENOUS | Status: AC

## 2024-03-15 MED ORDER — ORAL CARE MOUTH RINSE: 15.0000 mL | OROMUCOSAL | Status: DC | PRN

## 2024-03-15 MED ORDER — PANTOPRAZOLE SODIUM 40 MG PO TBEC
40.0000 mg | DELAYED_RELEASE_TABLET | Freq: Every day | ORAL | Status: DC
  Administered 2024-03-17 – 2024-03-20 (×4): 40 mg via ORAL
  Filled 2024-03-15 (×4): qty 1

## 2024-03-15 MED ORDER — 0.9 % SODIUM CHLORIDE (POUR BTL) OPTIME
TOPICAL | Status: DC | PRN
  Administered 2024-03-15: 5000 mL

## 2024-03-15 MED ORDER — ACETAMINOPHEN 500 MG PO TABS
1000.0000 mg | ORAL_TABLET | Freq: Four times a day (QID) | ORAL | Status: DC
  Administered 2024-03-16 – 2024-03-20 (×14): 1000 mg via ORAL
  Filled 2024-03-15 (×14): qty 2

## 2024-03-15 MED ORDER — PROPOFOL 10 MG/ML IV BOLUS
INTRAVENOUS | Status: AC
  Filled 2024-03-15: qty 20

## 2024-03-15 MED ORDER — DEXMEDETOMIDINE HCL IN NACL 400 MCG/100ML IV SOLN
0.0000 ug/kg/h | INTRAVENOUS | Status: DC
  Administered 2024-03-15: 0.7 ug/kg/h via INTRAVENOUS
  Filled 2024-03-15: qty 100

## 2024-03-15 MED ORDER — LACTATED RINGERS IV SOLN: INTRAVENOUS | Status: DC

## 2024-03-15 MED ORDER — PANTOPRAZOLE SODIUM 40 MG IV SOLR
40.0000 mg | Freq: Every day | INTRAVENOUS | Status: AC
  Administered 2024-03-15 – 2024-03-16 (×2): 40 mg via INTRAVENOUS
  Filled 2024-03-15 (×2): qty 10

## 2024-03-15 MED ORDER — METOPROLOL TARTRATE 12.5 MG HALF TABLET: 12.5000 mg | ORAL_TABLET | Freq: Once | ORAL | Status: AC

## 2024-03-15 MED ORDER — LACTATED RINGERS IV SOLN: INTRAVENOUS | Status: AC

## 2024-03-15 MED ORDER — SODIUM CHLORIDE 0.9% FLUSH: 3.0000 mL | INTRAVENOUS | Status: DC | PRN

## 2024-03-15 MED ORDER — ASPIRIN 81 MG PO CHEW: 324.0000 mg | CHEWABLE_TABLET | Freq: Every day | ORAL | Status: DC

## 2024-03-15 MED ORDER — CHLORHEXIDINE GLUCONATE 0.12 % MT SOLN
OROMUCOSAL | Status: AC
  Administered 2024-03-15: 15 mL via OROMUCOSAL
  Filled 2024-03-15: qty 15

## 2024-03-15 MED ORDER — METOCLOPRAMIDE HCL 5 MG/ML IJ SOLN
10.0000 mg | Freq: Four times a day (QID) | INTRAMUSCULAR | Status: AC
  Administered 2024-03-15 – 2024-03-17 (×6): 10 mg via INTRAVENOUS
  Filled 2024-03-15 (×6): qty 2

## 2024-03-15 MED ORDER — CHLORHEXIDINE GLUCONATE 0.12 % MT SOLN: 15.0000 mL | Freq: Once | OROMUCOSAL | Status: AC

## 2024-03-15 MED ORDER — ALBUMIN HUMAN 5 % IV SOLN
250.0000 mL | INTRAVENOUS | Status: DC | PRN
  Administered 2024-03-15 (×3): 12.5 g via INTRAVENOUS
  Filled 2024-03-15: qty 250

## 2024-03-15 MED ADMIN — Sugammadex Sodium IV 200 MG/2ML (Base Equivalent): 200 mg | INTRAVENOUS | NDC 00006542302

## 2024-03-15 SURGICAL SUPPLY — 69 items
ADAPTER MULTI PERFUSION 15 (ADAPTER) ×2 IMPLANT
BAG DECANTER FOR FLEXI CONT (MISCELLANEOUS) ×2 IMPLANT
BLADE CLIPPER SURG (BLADE) ×2 IMPLANT
BLADE STERNUM SYSTEM 6 (BLADE) ×2 IMPLANT
BLADE SURG 15 STRL LF DISP TIS (BLADE) ×2 IMPLANT
CANISTER SUCTION 3000ML PPV (SUCTIONS) ×2 IMPLANT
CANNULA AORTIC ROOT 9FR (CANNULA) ×2 IMPLANT
CANNULA MC2 2 STG 29/37 NON-V (CANNULA) ×2 IMPLANT
CANNULA NON VENT 20FR 12 (CANNULA) ×2 IMPLANT
CANNULA NON VENT 22FR 12 (CANNULA) IMPLANT
CATH HEART VENT LEFT (CATHETERS) ×2 IMPLANT
CATH ROBINSON RED A/P 18FR (CATHETERS) ×6 IMPLANT
CNTNR URN SCR LID CUP LEK RST (MISCELLANEOUS) ×2 IMPLANT
CONTAINER PROTECT SURGISLUSH (MISCELLANEOUS) ×4 IMPLANT
COVER SURGICAL LIGHT HANDLE (MISCELLANEOUS) ×2 IMPLANT
DEVICE SUT CK QUICK LOAD INDV (Prosthesis & Implant Heart) IMPLANT
DEVICE SUT CK QUICK LOAD MINI (Prosthesis & Implant Heart) IMPLANT
DRAIN CHANNEL 19F RND (DRAIN) ×4 IMPLANT
DRAPE SRG 135X102X78XABS (DRAPES) ×2 IMPLANT
DRAPE WARM FLUID 44X44 (DRAPES) ×2 IMPLANT
DRESSING AQUACEL AG SP 3.5X10 (GAUZE/BANDAGES/DRESSINGS) IMPLANT
ELECTRODE BLDE 4.0 EZ CLN MEGD (MISCELLANEOUS) ×2 IMPLANT
ELECTRODE REM PT RTRN 9FT ADLT (ELECTROSURGICAL) ×4 IMPLANT
ELECTRODE SOLI GEL RDN PROPADZ (MISCELLANEOUS) ×2 IMPLANT
FELT TEFLON 1X6 (MISCELLANEOUS) ×2 IMPLANT
GAUZE SPONGE 4X4 12PLY STRL (GAUZE/BANDAGES/DRESSINGS) ×2 IMPLANT
GAUZE SPONGE 4X4 12PLY STRL LF (GAUZE/BANDAGES/DRESSINGS) IMPLANT
GLOVE BIO SURGEON STRL SZ 6 (GLOVE) IMPLANT
GLOVE BIO SURGEON STRL SZ 6.5 (GLOVE) IMPLANT
GLOVE BIO SURGEON STRL SZ7 (GLOVE) IMPLANT
GLOVE BIO SURGEON STRL SZ7.5 (GLOVE) ×4 IMPLANT
GOWN STRL REUS W/ TWL LRG LVL3 (GOWN DISPOSABLE) ×8 IMPLANT
GOWN STRL REUS W/ TWL XL LVL3 (GOWN DISPOSABLE) ×4 IMPLANT
GOWN STRL SURGICAL XL XLNG (GOWN DISPOSABLE) ×2 IMPLANT
HEMOSTAT POWDER SURGIFOAM 1G (HEMOSTASIS) ×4 IMPLANT
HEMOSTAT SURGICEL 2X14 (HEMOSTASIS) IMPLANT
INSERT FOGARTY XLG (MISCELLANEOUS) ×2 IMPLANT
INSERT SUTURE HOLDER (MISCELLANEOUS) ×2 IMPLANT
KIT BASIN OR (CUSTOM PROCEDURE TRAY) ×2 IMPLANT
KIT SUT CK MINI COMBO 4X17 (Prosthesis & Implant Heart) IMPLANT
KIT TURNOVER KIT B (KITS) ×2 IMPLANT
LEAD PACING MYOCARDI (MISCELLANEOUS) ×2 IMPLANT
LINE VENT (MISCELLANEOUS) IMPLANT
PACK E OPEN HEART (SUTURE) ×2 IMPLANT
PACK OPEN HEART (CUSTOM PROCEDURE TRAY) ×2 IMPLANT
PAD ARMBOARD POSITIONER FOAM (MISCELLANEOUS) ×4 IMPLANT
POSITIONER HEAD DONUT 9IN (MISCELLANEOUS) ×2 IMPLANT
SET MPS 3-ND DEL (MISCELLANEOUS) IMPLANT
SOLN 0.9% NACL POUR BTL 1000ML (IV SOLUTION) ×12 IMPLANT
SOLN STERILE WATER BTL 1000 ML (IV SOLUTION) ×4 IMPLANT
SUT EB EXC GRN/WHT 2-0 V-5 (SUTURE) ×4 IMPLANT
SUT ETHIBOND 2 0 SH 36X2 (SUTURE) IMPLANT
SUT ETHIBOND X763 2 0 SH 1 (SUTURE) ×4 IMPLANT
SUT MNCRL AB 3-0 PS2 18 (SUTURE) ×4 IMPLANT
SUT PDS AB 1 CTX 36 (SUTURE) ×4 IMPLANT
SUT PROLENE 3 0 SH DA (SUTURE) IMPLANT
SUT PROLENE 4 0 SH DA (SUTURE) ×2 IMPLANT
SUT PROLENE 4-0 RB1 .5 CRCL 36 (SUTURE) ×6 IMPLANT
SUT STEEL 6MS V (SUTURE) IMPLANT
SUT STEEL STERNAL CCS#1 18IN (SUTURE) IMPLANT
SYSTEM SAHARA CHEST DRAIN ATS (WOUND CARE) ×2 IMPLANT
TAPE CLOTH SURG 4X10 WHT LF (GAUZE/BANDAGES/DRESSINGS) IMPLANT
TAPE PAPER 2X10 WHT MICROPORE (GAUZE/BANDAGES/DRESSINGS) IMPLANT
TOWEL GREEN STERILE (TOWEL DISPOSABLE) ×2 IMPLANT
TOWEL GREEN STERILE FF (TOWEL DISPOSABLE) ×2 IMPLANT
TRAY FOLEY SLVR 16FR TEMP STAT (SET/KITS/TRAYS/PACK) ×2 IMPLANT
TUBE SUCT INTRACARD DLP 20F (MISCELLANEOUS) ×2 IMPLANT
UNDERPAD 30X36 HEAVY ABSORB (UNDERPADS AND DIAPERS) ×2 IMPLANT
VALVE ON-X AORTIC 25 (Prosthesis & Implant Heart) IMPLANT

## 2024-03-15 NOTE — Anesthesia Postprocedure Evaluation (Signed)
"   Anesthesia Post Note  Patient: Dustin [name]  Procedure(s) Performed: REPLACEMENT, AORTIC VALVE, OPEN, USING ON-X VALVE SIZE 25 MM (Chest) ECHOCARDIOGRAM, TRANSESOPHAGEAL, INTRAOPERATIVE     Patient location during evaluation: SICU Anesthesia Type: General Level of consciousness: sedated Pain management: pain level controlled Vital Signs Assessment: post-procedure vital signs reviewed and stable Respiratory status: patient remains intubated per anesthesia plan Cardiovascular status: stable Postop Assessment: no apparent nausea or vomiting Anesthetic complications: no   No notable events documented.  Last Vitals:  Vitals:   03/15/24 1822 03/15/24 1823  BP:    Pulse: 75 75  Resp: 16 16  Temp: 37.4 C 37.4 C  SpO2: 98% 98%    Last Pain:  Vitals:   03/15/24 1050  TempSrc:   PainSc: 0-No pain                 Dustin [name],W. EDMOND      "

## 2024-03-15 NOTE — Anesthesia Preprocedure Evaluation (Addendum)
"                                    Anesthesia Evaluation  Patient identified by MRN, date of birth, ID band Patient awake    Reviewed: Allergy & Precautions, H&P , NPO status , Patient's Chart, lab work & pertinent test results, reviewed documented beta blocker date and time   Airway Mallampati: III  TM Distance: >3 FB Neck ROM: Full    Dental no notable dental hx. (+) Teeth Intact   Pulmonary former smoker   Pulmonary exam normal breath sounds clear to auscultation       Cardiovascular hypertension, Pt. on medications and Pt. on home beta blockers + Valvular Problems/Murmurs AI  Rhythm:Regular Rate:Normal     Neuro/Psych negative neurological ROS  negative psych ROS   GI/Hepatic negative GI ROS, Neg liver ROS,,,  Endo/Other    Class 3 obesity  Renal/GU negative Renal ROS  negative genitourinary   Musculoskeletal   Abdominal   Peds  Hematology negative hematology ROS (+)   Anesthesia Other Findings   Reproductive/Obstetrics negative OB ROS                              Anesthesia Physical Anesthesia Plan  ASA: 4  Anesthesia Plan: General   Post-op Pain Management: Tylenol  PO (pre-op)*   Induction: Intravenous  PONV Risk Score and Plan: 2 and Ondansetron , Dexamethasone and Midazolam   Airway Management Planned: Oral ETT  Additional Equipment: Arterial line, CVP, TEE and Ultrasound Guidance Line Placement  Intra-op Plan:   Post-operative Plan: Post-operative intubation/ventilation  Informed Consent: I have reviewed the patients History and Physical, chart, labs and discussed the procedure including the risks, benefits and alternatives for the proposed anesthesia with the patient or authorized representative who has indicated his/her understanding and acceptance.     Dental advisory given  Plan Discussed with: CRNA  Anesthesia Plan Comments:          Anesthesia Quick Evaluation  "

## 2024-03-15 NOTE — Anesthesia Procedure Notes (Signed)
 Central Venous Catheter Insertion Performed by: Epifanio Fallow, MD, anesthesiologist Start/End12/22/2025 11:35 AM, 03/15/2024 11:55 AM Patient location: Pre-op. Preanesthetic checklist: patient identified, IV checked, site marked, risks and benefits discussed, surgical consent, monitors and equipment checked, pre-op evaluation, timeout performed and anesthesia consent Position: Trendelenburg Lidocaine  1% used for infiltration and patient sedated Hand hygiene performed , maximum sterile barriers used  and Seldinger technique used Catheter size: 9 Fr Total catheter length 10. Central line was placed.MAC introducer Procedure performed using ultrasound to evaluate access site. Ultrasound Notes:relevant anatomy identified, ultrasound used to visualize needle entry, vessel patent under ultrasound and image(s) printed for medical record. Attempts: 1 Following insertion, line sutured, dressing applied and Biopatch. Post procedure assessment: blood return through all ports, free fluid flow and no air  Patient tolerated the procedure well with no immediate complications.

## 2024-03-15 NOTE — Consult Note (Signed)
 "  NAME:  Dustin [name], MRN:  [id number], DOB:  04/13/1980, LOS: 0 ADMISSION DATE:  03/15/2024, CONSULTATION DATE:  12/22 REFERRING MD:  Shyrl, CHIEF COMPLAINT:  post operative ICU medical support    History of Present Illness:  43 year old male patient with known history of bicuspid aortic valve with associated aortic stenosis and regurgitation underwent surgical consultation initially on 12/12 as he was starting to develop exertional dyspnea in addition to lower extremity swelling.  Preoperative cardiac catheterization conducted on 12/5.  Right heart cath pulmonary artery pressure 45/24 with mean 34 mmHg.  Pulmonary capillary wedge pressure 27 cardiac index 2.3.  WHO group 2 PAH.  Left heart cath normal coronary coronary artery disease.  LVEDP 25 mmHg Presented for elective aortic valve replacement on 12/22  OR events Bypass time 2 hrs 9 min  Clamp time 1hr Blood products Cell saver 945 EBL 1225 Fluids 1.5 liter crystalloid     Pertinent  Medical History  Hyperlipidemia, hypertension.  Prior cholecystectomy Bicuspid aortic valve with aortic stenosis and regurgitation.  And 4 cm ascending aortic aneurysm Significant Hospital Events: Including procedures, antibiotic start and stop dates in addition to other pertinent events   12/22 admitted post Mechanical AVR  Interim History / Subjective:  Arrived on unit post op Initial CT output 10 ml 41mcg/min Neo SR 60s backup pacer set at 50  Objective    Blood pressure (!) 137/110, pulse 63, temperature 97.9 F (36.6 C), temperature source Oral, resp. rate 19, height 6' (1.829 m), weight 118.8 kg, SpO2 97%.        Intake/Output Summary (Last 24 hours) at 03/15/2024 1640 Last data filed at 03/15/2024 1627 Gross per 24 hour  Intake 1445 ml  Output 1600 ml  Net -155 ml   Filed Weights   03/15/24 0956  Weight: 118.8 kg    Examination: General: sedated on dex  HENT: NCAT no JVD orally intubated Lungs: clear currently  on full vent support VT 620 rr 16 (rt 22) peep 5 fio2 50% Cardiovascular: sternal and mediastinal dressing intact.  SR pacer wires and pacer rate set at 60. Pts current rate 60s sinus. CTs 10 cc  Abdomen: soft Extremities: warm brisk CR  Neuro: sedated GU: cl yellow   Resolved problem list   Assessment and Plan   Severe aortic insufficiency now status post aortic valve replacement 4 cm ascending aortic aneurysm F/u post operative ABG, CXR, coags, CBC, chemistry and 12-lead Passive rewarming and initiate precedex  wean once normothermic and hemodynamically stable Continue post operative telemetry monitoring for conduction disturbances Ensure temporary epicardial pacing wires in place with back up rate for symptomatic bradycardia or AVB MAP goal 65-80; SBP <120 using  post-operative orderset w/ PRN pressors, inoptropes and antihypertensive management as indicated.  Ensure euvolemic Close observation for evidence of bleeding, including chest tube output and surgical site.Monitor Chest tube output if  >400cc in 1 hr, >300 for 2 consecutive hours, >200cc for three consecutive hours notify surgical team Tight glycemic control Multi-modal analgesia  SCDs immediate post-op; w/  post-operative AC once [facility] w/ cardiac surgeon, planning on starting warfarin in am  Complete prophylactic antibiotics    Post op Vent management  Plan F/u pcxr and abg Initiate wean protocol once hemodynamics stable and normothermic VAP bundle  Expected acute blood loss anemia  Baseline hgb: 14.9  Plan Trend CBC Transfuse for active bleeding or hgb < 9    Labs   CBC: Recent Labs  Lab 03/11/24 0904 03/15/24 1244 03/15/24  1450 03/15/24 1455 03/15/24 1514 03/15/24 1607 03/15/24 1610  WBC 6.9  --   --   --   --   --   --   HGB 15.0   < > 10.9* 10.9* 10.5* 11.2* 11.2*  HCT 45.8   < > 32.7* 32.0* 31.0* 33.0* 33.0*  MCV 79.9*  --   --   --   --   --   --   PLT 207  --  165  --   --   --   --    < > =  values in this interval not displayed.    Basic Metabolic Panel: Recent Labs  Lab 03/11/24 0904 03/15/24 1244 03/15/24 1254 03/15/24 1318 03/15/24 1346 03/15/24 1413 03/15/24 1455 03/15/24 1514 03/15/24 1607 03/15/24 1610  NA 139 139   < > 138   < > 139 135 135 138 135  K 4.5 4.3   < > 4.1   < > 5.0 5.0 5.1 4.8 4.8  CL 106 104  --  104  --  104  --  101 103  --   CO2 25  --   --   --   --   --   --   --   --   --   GLUCOSE 122* 98  --  117*  --  104*  --  130* 121*  --   BUN 19 16  --  15  --  15  --  15 16  --   CREATININE 0.83 0.80  --  0.70  --  0.70  --  0.80 0.80  --   CALCIUM  9.4  --   --   --   --   --   --   --   --   --    < > = values in this interval not displayed.   GFR: Estimated Creatinine Clearance: 156.8 mL/min (by C-G formula based on SCr of 0.8 mg/dL). Recent Labs  Lab 03/11/24 0904  WBC 6.9    Liver Function Tests: Recent Labs  Lab 03/11/24 0904  AST 20  ALT 15  ALKPHOS 75  BILITOT 0.5  PROT 7.6  ALBUMIN  4.4   No results for input(s): LIPASE, AMYLASE in the last 168 hours. No results for input(s): AMMONIA in the last 168 hours.  ABG    Component Value Date/Time   PHART 7.364 03/15/2024 1610   PCO2ART 43.1 03/15/2024 1610   PO2ART 341 (H) 03/15/2024 1610   HCO3 24.6 03/15/2024 1610   TCO2 26 03/15/2024 1610   ACIDBASEDEF 1.0 03/15/2024 1610   O2SAT 100 03/15/2024 1610     Coagulation Profile: Recent Labs  Lab 03/11/24 0904  INR 1.0    Cardiac Enzymes: No results for input(s): CKTOTAL, CKMB, CKMBINDEX, TROPONINI in the last 168 hours.  HbA1C: Hgb A1c MFr Bld  Date/Time Value Ref Range Status  03/11/2024 09:04 AM 5.4 4.8 - 5.6 % Final    Comment:    (NOTE) Diagnosis of Diabetes The following HbA1c ranges recommended by the American Diabetes Association (ADA) may be used as an aid in the diagnosis of diabetes mellitus.  Hemoglobin             Suggested A1C NGSP%              Diagnosis  <5.7                    Non Diabetic  5.7-6.4  Pre-Diabetic  >6.4                   Diabetic  <7.0                   Glycemic control for                       adults with diabetes.      CBG: No results for input(s): GLUCAP in the last 168 hours.  Review of Systems:   Not able   Past Medical History:  He,  has a past medical history of Heart murmur, Hyperlipidemia, and Hypertension.   Surgical History:   Past Surgical History:  Procedure Laterality Date   CHOLECYSTECTOMY     RIGHT/LEFT HEART CATH AND CORONARY ANGIOGRAPHY N/A 02/27/2024   Procedure: RIGHT/LEFT HEART CATH AND CORONARY ANGIOGRAPHY;  Surgeon: Elmira Newman PARAS, MD;  Location: [facility] INVASIVE CV LAB;  Service: Cardiovascular;  Laterality: N/A;   TRANSESOPHAGEAL ECHOCARDIOGRAM (CATH LAB) N/A 02/27/2024   Procedure: TRANSESOPHAGEAL ECHOCARDIOGRAM;  Surgeon: Kriste Emeline BRAVO, DO;  Location: [facility] INVASIVE CV LAB;  Service: Cardiovascular;  Laterality: N/A;     Social History:   reports that he quit smoking about 10 years ago. His smoking use included cigarettes. He started smoking about 11 years ago. He has a 0.3 pack-year smoking history. He has never used smokeless tobacco. He reports that he does not currently use alcohol. He reports that he does not use drugs.   Family History:  His family history includes Diabetes in his brother, father, and sister; Heart attack in his father; Hypertension in his brother, mother, and sister.   Allergies Allergies[1]   Home Medications  Prior to Admission medications  Medication Sig Start Date End Date Taking? Authorizing Provider  atenolol  (TENORMIN ) 50 MG tablet TAKE 1 TABLET(50 MG) BY MOUTH DAILY Patient taking differently: Take 50 mg by mouth every evening. 04/29/23  Yes Patwardhan, Newman PARAS, MD     Critical care time:  I personally  spent 32 minutes  on this patient which included: review of medical records, nursing notes, progress notes, evaluation, interpretation of lab data and  diagnostic studies, taking independent history, performing exam, documenting plan, ordering diagnostics and interventions for the following critical care issues: Acute respiratory failure with the following interventions which included: prevention of further deterioration                [1] No Known Allergies  "

## 2024-03-15 NOTE — Discharge Instructions (Addendum)
 Discharge Instructions:  1. You may shower, please wash incisions daily with soap and water  and keep dry.  If you wish to cover wounds with dressing you may do so but please keep clean and change daily.  No tub baths or swimming until incisions have completely healed.  If your incisions become red or develop any drainage please call our office at [phone]  2. No Driving until cleared by Dr. Lang office and you are no longer using narcotic pain medications  3. Monitor your weight daily.. Please use the same scale and weigh at same time... If you gain 5-10 lbs in 48 hours with associated lower extremity swelling, please contact our office at [phone]  4. Fever of 101.5 for at least 24 hours with no source, please contact our office at [phone]  5. Activity- up as tolerated, please walk at least 3 times per day.  Avoid strenuous activity, no lifting, pushing, or pulling with your arms over 8-10 lbs for a minimum of 6 weeks  6. If any questions or concerns arise, please do not hesitate to contact our office at [phone]   Warfarin Information Warfarin is a blood thinner (anticoagulant). Anticoagulants help to prevent the formation of blood clots or keep them from getting bigger. Your health care provider will monitor the anticoagulation effect of warfarin closely and will adjust your medicine as needed. Who should use warfarin? Warfarin is prescribed for people who have blood clots, or who are at risk for developing harmful blood clots, such as people who: Have mechanical heart valves. Have irregular heart rhythms (atrial fibrillation). Have certain clotting disorders. Have had blood clots in the past or are currently receiving treatment for them. This includes people who have had a stroke, blood clots in the lungs (pulmonary embolism, or PE), or blood clots in the legs (deep vein thrombosis,or DVT). How is warfarin taken? Warfarin is taken by mouth (orally). Warfarin  tablets come in different strengths. The strength is printed on the tablet, and each strength is a different color. If you get a new prescription and the color of your tablet is different than usual, tell your pharmacist or health care provider immediately. Take warfarin exactly as told by your health care provider, at the same time every day. Doing this helps you avoid bleeding or blood clots that could result in serious injury, pain, or disability. Contact your health care provider if a dose is forgotten or missed. Do not change or take additional dosesto make up for missed or accidental extra doses. What blood tests do I need while taking warfarin?  Warfarin is a medicine that needs to be closely monitored with blood tests. It is very important to keep all lab visits and follow-up visits with your health care provider. These tests measure the blood's ability to clot and are called prothrombin tests (PT)or international normalized ratio (INR) tests. These tests can be done with a finger stick or a blood draw. What does the INR test result mean? The PT test results will be reported as the INR. Your health care provider will tell you your target INR range. If your INR is not in your target range, your health care provider may adjust your dosage. If your INR is above your target range, there is a risk of bleeding. Your dosage of warfarin may need to be decreased. If your INR is below your target range, there is a risk of clotting. Your dosage of warfarin may need to be increased. How often is the  INR test needed? When you first start warfarin, you will usually have your INR checked every few days until the health care provider determines the correct dosage of warfarin. After you have reached your target INR, your INR will be tested less often. However, you will need to have your INR checked at least once every 4-6 weeks while you take warfarin. Some people may be able to use home monitoring to check  their INR. Ask your health care provider if this applies to you. What are the side effects of warfarin? Too much warfarin can cause bleeding or hemorrhage in any part of the body, such as: Bleeding from the gums. Unexplained bruises or bruises that get larger. A nosebleed that is not easily stopped. Bleeding in the brain (hemorrhagic stroke). Coughing up or vomiting blood. Blood in the urine or stools. Warfarin may also cause: Skin rash or irritations. Nausea that does not go away. Severe pain in the back or joints. Painful toes that turn blue or purple (purple toe syndrome). Painful ulcers that do not go away (skin necrosis). What precautions do I need to take while using warfarin? Wear a medical alert bracelet or carry a card that lists what medicines you take. Make sure that all health care providers, including your dentist, know you are taking warfarin. Avoid situations that cause bleeding by: Using a softer toothbrush. Flossing with waxed floss. Shaving with an neurosurgeon, not with a blade. Limiting your use of sharp objects. Avoiding activities that put you at risk for injury, such as contact sports. What do I need to know about warfarin and pregnancy or breastfeeding? If you are taking warfarin and you become pregnant, or plan to become pregnant, contact your health care provider right away. Though warfarin has been associated with birth defects, it can be used in some cases after weighing risks to mother and baby. If you plan to breastfeed while taking warfarin, talk with your health care provider first. What do I need to know about warfarin and alcohol or drug use? Do not drink alcohol if: Your health care provider tells you not to drink. You are pregnant, may be pregnant, or are planning to become pregnant. If you drink alcohol: Limit how much you have to: 0-1 drink a day for women. 0-2 drinks a day for men. Know how much alcohol is in your drink. In the [place] one  drink equals one 12 oz bottle of beer (355 mL), one 5 oz glass of wine (148 mL), or one 1 oz glass of hard liquor (44 mL). If you change the amount of alcohol that you drink, tell your health care provider. Your warfarin dosage may need to be changed. Do not use any products that contain nicotine or tobacco. These products include cigarettes, chewing tobacco, and vaping devices, such as e-cigarettes. If you need help quitting, ask your health care provider. If you use nicotine or tobacco products and change the amount that you use, tell your health care provider. Your warfarin dosage may need to be changed. Avoid drug use while taking warfarin. The effects of drugs on warfarin are not known. What do I need to know about warfarin and other medicines or supplements? Many prescription and over-the-counter medicines can interfere with warfarin. Talk with your health care provider or your pharmacist before starting or stopping any new medicines. This includes vitamins, herbs, supplements, and pain medicines. Some common over-the-counter medicines that may increase the risk of dangerous bleeding while taking warfarin include: Aspirin . NSAIDs, such  as ibuprofen or naproxen. Vitamin E. Fish oils. What do I need to know about warfarin and my diet? Vitamin K decreases the effect of warfarin, and it is found in many foods. Eat a consistent amount of foods that contain vitamin K. For example, you may decide to eat 2 servings of vitamin K-containing foods each day. It is important to maintain a normal, balanced diet while taking warfarin. Avoid major changes in your diet. If you are going to change your diet, talk with your health care provider before making changes. Your health care provider may recommend that you work with a dietitian. Contact a health care provider if you: Miss a dose. Take an extra dose. Plan to have any kind of surgery or procedure. Ask whether you should stop taking warfarin or change  your dose before your surgery. Are unable to take your medicine due to nausea, vomiting, or diarrhea. Have any major changes in your diet, or you plan to make major changes in your diet. Start or stop any over-the-counter medicine, prescription medicine, herbal supplement, or dietary supplement. Become pregnant, plan to become pregnant, or think you may be pregnant. Have menstrual periods that are heavier than usual. Have unusual bruising. Get help right away if you: Have signs of an allergic reaction, such as: Swelling of the lips, face, tongue, mouth, or throat. Rash or itchy, red, swollen areas of skin (hives). Trouble breathing. Chest tightness. Fall or have an accident, especially if you hit your head. Have signs that your blood is too thin, such as: Blood in your urine. Your urine may look reddish, pinkish, or tea-colored. Blood in your stool. Your stool may be black or bright red. Coughing up or vomiting blood. The blood may be bright red, or it may look like coffee grounds. Bleeding that does not stop after applying pressure to the area for 30 minutes. Have signs of a blood clot in your leg or arm, such as: Pain or swelling in your leg or arm. Skin that is red or warm to the touch on your arm or leg. Have signs of blood in your lung, such as: Shortness of breath or difficulty breathing. Chest pain. Unexplained fever. Have any symptoms of a stroke. BE FAST is an easy way to remember the main warning signs of a stroke: B - Balance. Signs are dizziness, sudden trouble walking, or loss of balance. E - Eyes. Signs are trouble seeing or a sudden change in vision. F - Face. Signs are sudden weakness or numbness of the face, or the face or eyelid drooping on one side. A - Arms. Signs are weakness or numbness in an arm. This happens suddenly and usually on one side of the body. S - Speech. Signs are sudden trouble speaking, slurred speech, or trouble understanding what people say. T  - Time. Time to call emergency services. Write down what time symptoms started. Have other signs of a stroke, such as: A sudden, severe headache with no known cause. Nausea or vomiting. Seizure. Have other signs of a reaction to warfarin, such as: Purple or blue toes. Skin ulcers that do not go away. These symptoms may represent a serious problem that is an emergency. Do not wait to see if the symptoms will go away. Get medical help right away. Call your local emergency services (911 in the [place] Do not drive yourself to the hospital. Summary Warfarin is a medicine that thins blood. It is used to prevent or treat blood clots. You must be  monitored closely while on this medicine. Keep all follow-up visits. Make sure that you know your target INR range and your warfarin dosage. Wear or carry identification that says you are taking warfarin. Take warfarin at the same time every day. Call your health care provider if you miss a dose or if you take an extra dose. Do not change the dosage of warfarin on your own. Know the signs and symptoms of blood clots, bleeding, and a stroke. Know when to get emergency medical help. This information is not intended to replace advice given to you by your health care provider. Make sure you discuss any questions you have with your health care provider. Document Revised: 12/05/2022 Document Reviewed: 12/05/2022 Elsevier Patient Education  2024 [employer]  ---------------------------------------------- Information on my medicine - Coumadin    (Warfarin)  Why was Coumadin  prescribed for you? Coumadin  was prescribed for you because you have a blood clot or a medical condition that can cause an increased risk of forming blood clots. Blood clots can cause serious health problems by blocking the flow of blood to the heart, lung, or brain. Coumadin  can prevent harmful blood clots from forming. As a reminder your indication for Coumadin  is:  Blood Clot Prevention  after Heart Valve Surgery  What test will check on my response to Coumadin ? While on Coumadin  (warfarin) you will need to have an INR test regularly to ensure that your dose is keeping you in the desired range. The INR (international normalized ratio) number is calculated from the result of the laboratory test called prothrombin time (PT).  If an INR APPOINTMENT HAS NOT ALREADY BEEN MADE FOR YOU please schedule an appointment to have this lab work done by your health care provider within 7 days. Your INR goal is usually a number between:  2 to 3 or your provider may give you a more narrow range like 2-2.5.  Ask your health care provider during an office visit what your goal INR is.  What  do you need to  know  About  COUMADIN ? Take Coumadin  (warfarin) exactly as prescribed by your healthcare provider about the same time each day.  DO NOT stop taking without talking to the doctor who prescribed the medication.  Stopping without other blood clot prevention medication to take the place of Coumadin  may increase your risk of developing a new clot or stroke.  Get refills before you run out.  What do you do if you miss a dose? If you miss a dose, take it as soon as you remember on the same day then continue your regularly scheduled regimen the next day.  Do not take two doses of Coumadin  at the same time.  Important Safety Information A possible side effect of Coumadin  (Warfarin) is an increased risk of bleeding. You should call your healthcare provider right away if you experience any of the following: Bleeding from an injury or your nose that does not stop. Unusual colored urine (red or dark brown) or unusual colored stools (red or black). Unusual bruising for unknown reasons. A serious fall or if you hit your head (even if there is no bleeding).  Some foods or medicines interact with Coumadin  (warfarin) and might alter your response to warfarin. To help avoid this: Eat a balanced diet, maintaining  a consistent amount of Vitamin K. Notify your provider about major diet changes you plan to make. Avoid alcohol or limit your intake to 1 drink for women and 2 drinks for men per day. (1  drink is 5 oz. wine, 12 oz. beer, or 1.5 oz. liquor.)  Make sure that ANY health care provider who prescribes medication for you knows that you are taking Coumadin  (warfarin).  Also make sure the healthcare provider who is monitoring your Coumadin  knows when you have started a new medication including herbals and non-prescription products.  Coumadin  (Warfarin)  Major Drug Interactions  Increased Warfarin Effect Decreased Warfarin Effect  Alcohol (large quantities) Antibiotics (esp. Septra/Bactrim, Flagyl, Cipro) Amiodarone (Cordarone) Aspirin  (ASA) Cimetidine (Tagamet) Megestrol (Megace) NSAIDs (ibuprofen, naproxen, etc.) Piroxicam (Feldene) Propafenone (Rythmol SR) Propranolol (Inderal) Isoniazid (INH) Posaconazole (Noxafil) Barbiturates (Phenobarbital) Carbamazepine (Tegretol) Chlordiazepoxide (Librium) Cholestyramine (Questran) Griseofulvin Oral Contraceptives Rifampin Sucralfate (Carafate) Vitamin K   Coumadin  (Warfarin) Major Herbal Interactions  Increased Warfarin Effect Decreased Warfarin Effect  Garlic Ginseng Ginkgo biloba Coenzyme Q10 Green tea St. Johns wort    Coumadin  (Warfarin) FOOD Interactions  Eat a consistent number of servings per week of foods HIGH in Vitamin K (1 serving =  cup)  Collards (cooked, or boiled & drained) Kale (cooked, or boiled & drained) Mustard greens (cooked, or boiled & drained) Parsley *serving size only =  cup Spinach (cooked, or boiled & drained) Swiss chard (cooked, or boiled & drained) Turnip greens (cooked, or boiled & drained)  Eat a consistent number of servings per week of foods MEDIUM-HIGH in Vitamin K (1 serving = 1 cup)  Asparagus (cooked, or boiled & drained) Broccoli (cooked, boiled & drained, or raw & chopped) Brussel  sprouts (cooked, or boiled & drained) *serving size only =  cup Lettuce, raw (green leaf, endive, romaine) Spinach, raw Turnip greens, raw & chopped   These websites have more information on Coumadin  (warfarin):  www.coumadin .com; www.ahrq.gov/consumer/coumadin .htm;

## 2024-03-15 NOTE — Interval H&P Note (Signed)
 History and Physical Interval Note:  03/15/2024 11:31 AM  Dustin [name]  has presented today for surgery, with the diagnosis of SEVERE AI.  The various methods of treatment have been discussed with the patient and family. After consideration of risks, benefits and other options for treatment, the patient has consented to  Procedures: REPLACEMENT, AORTIC VALVE, OPEN (N/A) ECHOCARDIOGRAM, TRANSESOPHAGEAL, INTRAOPERATIVE (N/A) as a surgical intervention.  The patient's history has been reviewed, patient examined, no change in status, stable for surgery.  I have reviewed the patient's chart and labs.  Questions were answered to the patient's satisfaction.     Laylana Gerwig MALVA Rayas

## 2024-03-15 NOTE — Hospital Course (Addendum)
" °  Referring: Elmira Newman PARAS, MD Primary Care: Sim Emery CROME, MD  History of Present Illness:     Dustin [name] is a 43 y.o. male who presents for surgical evaluation of a bicuspid valve with severe aortic valve regurgitation.  He is symptomatic with exertional dyspnea.  He denies any chest pain.  He has also had lower extremity swelling. He also has an ascending aneurysm of 4 cm. We discussed the risks and benefits of a mechanical aortic valve replacement. He is agreeable to proceed.   Hospital Course:  Mr. Criado was admitted to the hospital for elective surgery on 03/15/2024.  He was taken to the operating room where aortic valve placement was carried out utilizing a 25 mm On-X mechanical valve.  Following the procedure, he separated from cardiopulmonary bypass without difficulty.  He was transferred to the surgical ICU in stable condition.  The patient was extubated the evening of surgery.  He did not require post operative drip support for hemodynamics.  He was started on coumadin  at 2.5 mg daily for his mechanical valve.  He was somnolent after administration of Oxycodone  and his dose was reduced.  His arterial line was removed without difficulty.  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  Due to this further STD testing was performed and the results showed HIV 1 positive, he was started on a 10 day course of Valtrex .  His pacing wires were removed without difficulty on 03/17/2024.  He was hypertensive and tachycardic and his lopressor  dose was increased. He was felt stable for transfer to the floor. Oxygen was weaned as tolerated.  He remains on coumadin  for his mechanical AVR. His most recent INR is 2.2 and he will be discharged home on 5 mg of coumadin .  He remains clinically stable.  His surgical incisions are healing without evidence of infection.  He is medically stable for discharge home today.  "

## 2024-03-15 NOTE — Progress Notes (Signed)
" °  TCTS Evening Rounds:   Hemodynamically stable  CI = 2.2  Has started to wake up on vent.   Urine output good  CT output low  CBC    Component Value Date/Time   WBC 14.5 (H) 03/15/2024 1705   RBC 5.44 03/15/2024 1705   HGB 14.2 03/15/2024 1705   HGB 14.9 02/23/2024 0922   HCT 43.1 03/15/2024 1705   HCT 46.7 02/23/2024 0922   PLT 172 03/15/2024 1705   PLT 229 02/23/2024 0922   MCV 79.2 (L) 03/15/2024 1705   MCV 80 02/23/2024 0922   MCH 26.1 03/15/2024 1705   MCHC 32.9 03/15/2024 1705   RDW 13.1 03/15/2024 1705   RDW 13.3 02/23/2024 0922     BMET    Component Value Date/Time   NA 135 03/15/2024 1610   NA 138 02/23/2024 0922   K 4.8 03/15/2024 1610   CL 103 03/15/2024 1607   CO2 25 03/11/2024 0904   GLUCOSE 121 (H) 03/15/2024 1607   BUN 16 03/15/2024 1607   BUN 15 02/23/2024 0922   CREATININE 0.80 03/15/2024 1607   CALCIUM  9.4 03/11/2024 0904   EGFR 110 02/23/2024 0922   GFRNONAA >60 03/11/2024 0904     A/P:  Stable postop course. Continue current plans  "

## 2024-03-15 NOTE — Op Note (Signed)
 "    [address] [place] [address]             [id number]                                        03/15/2024 Patient:  Dustin [name] Pre-Op Dx: Bicuspid aortic valve Severe aortic valve regurgitation   Post-op Dx:  same Procedure: Aortic valve replacement with a 25mm On-X valve     Surgeon and Role:      * Antron Seth, Linnie KIDD, MD - Primary    * CHRISTELLA Becket, PA-C  An experienced assistant was required given the complexity of this surgery and the standard of surgical care. The assistant was needed for exposure, dissection, suctioning, retraction of delicate tissues and sutures, instrument exchange and for overall help during this procedure.    Anesthesia  general EBL:  Blood Administration: non   Drains: 15 F blake drain:  mediastinal X 2 Wires: V Counts: correct   Indications: 43 y.o. male with severe aortic valve regurgitation secondary to a bicuspid valve.  He also has an ascending aneurysm of 4 cm.  We discussed the risks and benefits of a mechanical aortic valve replacement.  He is agreeable to proceed.   Findings: Bicuspid valve with fusion of left-right commissure.  Operative Technique: All invasive lines were placed in pre-op holding.  After the risks, benefits and alternatives were thoroughly discussed, the patient was brought to the operative theatre.  Anesthesia was induced, and the patient was prepped and draped in normal sterile fashion.  An appropriate surgical pause was performed, and pre-operative antibiotics were dosed accordingly.  We began with an incision over the chest for the sternotomy.  This was carried down with bovie cautery, and the sternum was divided with a reciprocating saw.  Meticulous hemostasis was obtained.  The patient was systemically heparinized.   The sternal retractor was placed.  The pericardium was divided in the midline and fashioned into a cradle with pericardial stitches.   After we confirmed an  appropriate ACT, the ascending aorta was cannulated in standard fashion.  The right atrial appendage was used for venous cannulation site.  Cardiopulmonary bypass was initiated and we began to cool the patient to 32 degrees. The cross clamp was applied, and a dose of anterograde cardioplegia was given with good arrest of the heart.  Our aortotomy was made and directed toward the non coronary cusp.  The valve was inspected.  All leaflets were excised.  The annulus was sized to a 25mm On-X valve.  The left ventricle was then copiously irrigated.  Pledgeted mattress sutures were placed circumferentially through the annulus.  These sutures were then passed through the sewing ring of the valve.  Once the valve was seated in the annulus, it was secured with Core-knot sutures.  We began to rewarm, and close our aortotomy in 2 layers.  A re-animation dose of cardioplegia was given.  After de-airing the heart, the aortic cross clamp was removed.  We checked our valve function, and for air using the TEE.  Once we were satisfying, we separated from cardiopulmonary bypass without event.    The heparin  was reversed with protamine , and hemostasis was obtained.  Chest tubes and wires were placed, and the sternum was re-approximated with with sternal wires.  The soft tissue and skin were re-approximated wth  absorbable suture.    The patient tolerated the procedure without any immediate complications, and was transferred to the ICU in guarded condition.  Eiliana Drone O Desteny Freeman  "

## 2024-03-15 NOTE — Procedures (Signed)
 Extubation Procedure Note  Patient Details:   Name: Dustin [name] DOB: 1980/05/24 MRN: [id number]   Airway Documentation:    Vent end date: (S) 03/15/24 Vent end time: (S) 2202   Evaluation  O2 sats: stable throughout Complications: No apparent complications Patient did tolerate procedure well. Bilateral Breath Sounds: Clear, Diminished   Yes Pt extubated to [facility] per rapid wean protocol. Prior to extubation patient had a NIF of -40, VC of 1L, was able to hold head up for 5 seconds, following commands, and had a positive cuff leak. Post extubation, patient able to speak and is A&Ox3.  Vicenta MATSU Elaine Middleton 03/15/2024, 10:06 PM

## 2024-03-15 NOTE — Discharge Summary (Signed)
 " Physician Discharge Summary  Patient ID: Dustin [name] MRN: [id number] DOB/AGE: 1981/02/18 43 y.o.  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   Chest pain 02/02/2020   Essential hypertension 02/02/2020   Discharge Diagnoses:   Patient Active Problem List   Diagnosis Date Noted   S/P aortic valve replacement with metallic valve 03/15/2024   Pain of left lower extremity 02/23/2024   Pre-procedure lab exam 08/11/2023   Fatigue 05/03/2021   Musculoskeletal pain 05/03/2021   Nonrheumatic aortic valve stenosis 08/30/2020   Severe aortic insufficiency 08/30/2020   Thoracic aortic aneurysm without rupture 08/30/2020   Palpitations 03/01/2020   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 male who presents for surgical evaluation of a bicuspid valve with severe aortic valve regurgitation.  He is symptomatic with exertional dyspnea.  He denies any chest pain.  He has also had lower extremity swelling. He also has an ascending aneurysm of 4 cm. We discussed the risks and benefits of a mechanical aortic  valve replacement. He is agreeable to proceed.   Hospital Course:  Mr. Bache was admitted to the hospital for elective surgery on 03/15/2024.  He was taken to the operating room where aortic valve placement was carried out utilizing a 25 mm On-X mechanical valve.  Following the procedure, he separated from cardiopulmonary bypass without difficulty.  He was transferred to the surgical ICU in stable condition.  The patient was extubated the evening of surgery.  He did not require post operative drip support for hemodynamics.  He was started on coumadin  at 2.5 mg daily for his mechanical valve.  He was somnolent after administration of Oxycodone  and his dose was reduced.  His arterial line was removed without difficulty.  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  Due to this further STD testing was performed and the results showed HIV 1 positive, he was started on a 10 day course of Valtrex .  His pacing wires were removed without difficulty on 03/17/2024.  He was hypertensive and tachycardic and his lopressor  dose was increased. He was felt stable for transfer to the floor. Oxygen was weaned as tolerated.  He remains on coumadin  for his mechanical AVR. His most recent INR is 2.2 and he will be discharged home on 5 mg of coumadin .  He remains clinically stable.  His surgical incisions are healing without evidence of infection.  He is medically stable for discharge home today.   Consults: pulmonary/intensive care  Significant Diagnostic Studies: cardiac graphics:   Echocardiogram:    Patient Name:   Dustin [name] Date of Exam: 02/27/2024  Medical Rec #:  [id number]     Height:       72.0 in  Accession #:    [id number]    Weight:       267.0 lb  Date of Birth:  [birth year]     BSA:          2.409 m  Patient Age:    43 years      BP:           175/46 mmHg  Patient Gender: M             HR:           94 bpm.  Exam Location:  Inpatient   Procedure: Transesophageal Echo,  Cardiac Doppler, Color Doppler and 3D  Echo            (Both Spectral and Color Flow Doppler were utilized during             procedure).   Indications:     Aortic Valve Disorder    History:         Patient has prior history of Echocardiogram examinations,  most                  recent 01/30/2023. Aortic Valve Disease; Risk                   Factors:Hypertension, Dyslipidemia and Family History of                   Coronary Artery Disease.    Sonographer:     Philomena Daring  Referring Phys:  [id number] EMELINE BRAVO SEGAL  Diagnosing Phys: Emeline Calender   PROCEDURE: After discussion of the risks and benefits of a TEE, an  informed consent was obtained from the patient. The transesophogeal probe  was passed without difficulty through the esophogus of the patient. Imaged  were obtained with the patient in a  left lateral decubitus position. Sedation performed by different  physician. The patient was monitored while under deep sedation.  Anesthestetic sedation was provided intravenously by Anesthesiology: 476mg   of Propofol . Image quality was excellent. The  patient developed no complications during the procedure.    IMPRESSIONS    1. Left ventricular ejection fraction, by estimation, is 60 to 65%. The  left ventricle has normal function.   2. Right ventricular systolic function is normal. The right ventricular  size is normal.   3. Left atrial size was moderately dilated. No left atrial/left atrial  appendage thrombus was detected. The LAA emptying velocity was 71 cm/s.   4. Right atrial size was moderately dilated.   5. The mitral valve is normal in structure. Mild mitral valve  regurgitation.   6. The aortic valve is bicuspid. Aortic valve regurgitation is severe.  Aortic valve sclerosis/calcification is present, without any evidence of  aortic stenosis.   7. 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   8. Aortic dilatation noted. There is mild dilatation of the ascending  aorta,  measuring 41 mm.   9. Anomalous middle right pulmonary vein.  10. 3D performed of the aortic valve and demonstrates Bicuspid aortic  valve.    Treatments:     SURGERY                             03/15/2024 Patient:  Dustin [name] Pre-Op Dx: Bicuspid aortic valve Severe aortic valve regurgitation   Post-op Dx:  same Procedure: Aortic valve  replacement with a 25mm On-X valve       Surgeon and Role:      * Lightfoot, Linnie KIDD, MD - Primary    * CHRISTELLA Becket, PA-C   Discharge Exam:  General appearance: alert, cooperative, and no distress Heart: regular rate and rhythm Lungs: clear to auscultation bilaterally Abdomen: benign Extremities: benign Wound: incis healing   Disposition:  Discharge disposition: 01-Home or Self Care       Discharge Instructions     Amb Referral to Cardiac Rehabilitation   Complete by: As directed    Diagnosis: Valve Replacement   Valve: Aortic   After initial evaluation and assessments completed: Virtual Based Care may be provided alone or in conjunction with Phase 2 Cardiac Rehab based on patient barriers.: Yes   Intensive Cardiac Rehabilitation (ICR) [facility] location only OR Traditional Cardiac Rehabilitation (TCR) *If criteria for ICR are not met will enroll in TCR ([facility] only): Yes      Allergies as of 03/20/2024   No Known Allergies      Medication List     STOP taking these medications    atenolol  50 MG tablet Commonly known as: TENORMIN        TAKE these medications    acetaminophen  500 MG tablet Commonly known as: TYLENOL  Take 2 tablets (1,000 mg total) by mouth every 6 (six) hours as needed.   aspirin  EC 81 MG tablet Take 1 tablet (81 mg total) by mouth daily. Swallow whole.   guaiFENesin  600 MG 12 hr tablet Commonly known as: MUCINEX  Take 1 tablet (600 mg total) by mouth 2 (two) times daily as needed for cough or to loosen phlegm.   metoprolol  tartrate 50 MG tablet Commonly known as: LOPRESSOR  Take 1 tablet (50  mg total) by mouth 2 (two) times daily.   traMADol  50 MG tablet Commonly known as: ULTRAM  Take 1 tablet (50 mg total) by mouth every 6 (six) hours as needed for up to 7 days for moderate pain (pain score 4-6).   valACYclovir  1000 MG tablet Commonly known as: VALTREX  Take 1 tablet (1,000 mg total) by mouth 2 (two) times daily.   warfarin 5 MG tablet Commonly known as: COUMADIN  Take 1 tablet (5 mg total) by mouth daily. As directed by the coumadin  clinic        Follow-up Information     Sim Emery CROME, MD. Schedule an appointment as soon as possible for a visit.   Specialty: Internal Medicine Why: Please contact appointment for hospital follow up in 1-2 weeks Contact information: [address] [place] [address] [phone]         Shyrl Linnie KIDD, MD Follow up on 03/26/2024.   Specialty: Cardiothoracic Surgery Why: Appointment is at 2:50, this is a virtual appointment Contact information: [address], [place] [address] [phone]         [facility] [employer] Follow up on 03/24/2024.   Specialty: Cardiology Why: Appointment is at 10:30 at coumadin  clinic 2 Contact information: [address] [place]  [address] [phone]                The patient has been discharged on:   1.Beta Blocker:  Yes [ X  ]                              No   [   ]  If No, reason:  2.Ace Inhibitor/ARB: Yes [   ]                                     No  [   n ]                                     If No, reason:recent BP's in 120's-140's on beta blocker  3.Statin:   Yes [   ]                  No  [ x  ]                  If No, reason: No CAD  4.ArleeneBETHA [place]  [  X ]                  No   [   ]                  If No, reason:  5. ACS on Admission? No  P2Y12 Inhibitor:  Yes  [   ]                                No  [ x ]    Signed:     "

## 2024-03-15 NOTE — Transfer of Care (Signed)
 Immediate Anesthesia Transfer of Care Note  Patient: Dustin [name]  Procedure(s) Performed: REPLACEMENT, AORTIC VALVE, OPEN, USING ON-X VALVE SIZE 25 MM (Chest) ECHOCARDIOGRAM, TRANSESOPHAGEAL, INTRAOPERATIVE  Patient Location: ICU  Anesthesia Type:General  Level of Consciousness: sedated and Patient remains intubated per anesthesia plan  Airway & Oxygen Therapy: Patient remains intubated per anesthesia plan and Patient placed on Ventilator (see vital sign flow sheet for setting)  Post-op Assessment: Report given to RN and Post -op Vital signs reviewed and stable  Post vital signs: Reviewed and stable  Last Vitals:  Vitals Value Taken Time  BP 124/82 03/15/24  17:01  Temp 37.4 C 03/15/24 17:01  Pulse 75 03/15/24 17:01  Resp 16 03/15/24 17:01  SpO2 95 % 03/15/24 17:01  Vitals shown include unfiled device data.  Last Pain:  Vitals:   03/15/24 1050  TempSrc:   PainSc: 0-No pain      Patients Stated Pain Goal: 3 (03/15/24 1045)  Complications: No notable events documented.

## 2024-03-15 NOTE — Anesthesia Procedure Notes (Signed)
 Arterial Line Insertion Start/End12/22/2025 11:45 AM, 03/15/2024 11:50 AM Performed by: Epifanio Fallow, MD, Chaney Ozell CROME, CRNA, CRNA  Patient location: Pre-op. Preanesthetic checklist: patient identified, IV checked, site marked, risks and benefits discussed, surgical consent, monitors and equipment checked, pre-op evaluation, timeout performed and anesthesia consent Lidocaine  1% used for infiltration and patient sedated Left, radial was placed Catheter size: 20 G Hand hygiene performed  and maximum sterile barriers used  Allen's test indicative of satisfactory collateral circulation Attempts: 1 Following insertion, dressing applied. Post procedure assessment: normal  Patient tolerated the procedure well with no immediate complications.

## 2024-03-15 NOTE — Brief Op Note (Signed)
 03/15/2024  3:34 PM  PATIENT:  Dustin [name]  43 y.o. male  PRE-OPERATIVE DIAGNOSIS:  SEVERE BICUSPID AORTIC INSUFFICIENCY  POST-OPERATIVE DIAGNOSIS:  SEVERE  BICUSPID AORTIC INSUFFICIENCY  PROCEDURE:   REPLACEMENT, AORTIC VALVE, OPEN, USING ON-X VALVE SIZE 25 MM   ECHOCARDIOGRAM, TRANSESOPHAGEAL, INTRAOPERATIVE  SURGEON:   Lightfoot, Linnie KIDD, MD - Primary  PHYSICIAN ASSISTANT: Darian Cansler  ASSISTANTS: Dyann Iha, RN, RN First Assistant    ANESTHESIA:   general  EBL:   BLOOD ADMINISTERED:none  DRAINS: Mediastinal drains   LOCAL MEDICATIONS USED:  NONE  SPECIMEN:  Aortic valve leaflets  DISPOSITION OF SPECIMEN:  PATHOLOGY  COUNTS:  Correct  DICTATION: .Dragon Dictation  PLAN OF CARE: Admit to inpatient   PATIENT DISPOSITION:  ICU - intubated and hemodynamically stable.   Delay start of Pharmacological VTE agent (>24hrs) due to surgical blood loss or risk of bleeding: yes

## 2024-03-16 ENCOUNTER — Encounter (HOSPITAL_COMMUNITY): Payer: Self-pay | Admitting: Thoracic Surgery (Cardiothoracic Vascular Surgery)

## 2024-03-16 ENCOUNTER — Telehealth: Payer: Self-pay | Admitting: Cardiology

## 2024-03-16 ENCOUNTER — Inpatient Hospital Stay (HOSPITAL_COMMUNITY)

## 2024-03-16 DIAGNOSIS — I351 Nonrheumatic aortic (valve) insufficiency: Secondary | ICD-10-CM | POA: Diagnosis not present

## 2024-03-16 DIAGNOSIS — A630 Anogenital (venereal) warts: Secondary | ICD-10-CM

## 2024-03-16 DIAGNOSIS — I7121 Aneurysm of the ascending aorta, without rupture: Secondary | ICD-10-CM | POA: Diagnosis not present

## 2024-03-16 DIAGNOSIS — Z952 Presence of prosthetic heart valve: Secondary | ICD-10-CM

## 2024-03-16 LAB — CBC
HCT: 38.7 % — ABNORMAL LOW (ref 39.0–52.0)
HCT: 40.4 % (ref 39.0–52.0)
Hemoglobin: 12.7 g/dL — ABNORMAL LOW (ref 13.0–17.0)
Hemoglobin: 13.1 g/dL (ref 13.0–17.0)
MCH: 26 pg (ref 26.0–34.0)
MCH: 25.8 pg — ABNORMAL LOW (ref 26.0–34.0)
MCHC: 32.8 g/dL (ref 30.0–36.0)
MCHC: 32.4 g/dL (ref 30.0–36.0)
MCV: 79.1 fL — ABNORMAL LOW (ref 80.0–100.0)
MCV: 79.7 fL — ABNORMAL LOW (ref 80.0–100.0)
Platelets: 183 K/uL (ref 150–400)
Platelets: 186 K/uL (ref 150–400)
RBC: 4.89 MIL/uL (ref 4.22–5.81)
RBC: 5.07 MIL/uL (ref 4.22–5.81)
RDW: 13.2 % (ref 11.5–15.5)
RDW: 13.4 % (ref 11.5–15.5)
WBC: 12.2 K/uL — ABNORMAL HIGH (ref 4.0–10.5)
WBC: 13.6 K/uL — ABNORMAL HIGH (ref 4.0–10.5)
nRBC: 0 % (ref 0.0–0.2)
nRBC: 0 % (ref 0.0–0.2)

## 2024-03-16 LAB — BASIC METABOLIC PANEL WITH GFR
Anion gap: 9 (ref 5–15)
Anion gap: 8 (ref 5–15)
BUN: 18 mg/dL (ref 6–20)
BUN: 19 mg/dL (ref 6–20)
CO2: 22 mmol/L (ref 22–32)
CO2: 27 mmol/L (ref 22–32)
Calcium: 7.9 mg/dL — ABNORMAL LOW (ref 8.9–10.3)
Calcium: 8.7 mg/dL — ABNORMAL LOW (ref 8.9–10.3)
Chloride: 108 mmol/L (ref 98–111)
Chloride: 103 mmol/L (ref 98–111)
Creatinine, Ser: 0.87 mg/dL (ref 0.61–1.24)
Creatinine, Ser: 0.89 mg/dL (ref 0.61–1.24)
GFR, Estimated: >60 mL/min
GFR, Estimated: >60 mL/min
Glucose, Bld: 147 mg/dL — ABNORMAL HIGH (ref 70–99)
Glucose, Bld: 117 mg/dL — ABNORMAL HIGH (ref 70–99)
Potassium: 4 mmol/L (ref 3.5–5.1)
Potassium: 4.3 mmol/L (ref 3.5–5.1)
Sodium: 139 mmol/L (ref 135–145)
Sodium: 137 mmol/L (ref 135–145)

## 2024-03-16 LAB — POCT I-STAT 7, (LYTES, BLD GAS, ICA,H+H)
Acid-base deficit: 2 mmol/L (ref 0.0–2.0)
Bicarbonate: 22.3 mmol/L (ref 20.0–28.0)
Calcium, Ion: 1.11 mmol/L — ABNORMAL LOW (ref 1.15–1.40)
HCT: 36 % — ABNORMAL LOW (ref 39.0–52.0)
Hemoglobin: 12.2 g/dL — ABNORMAL LOW (ref 13.0–17.0)
O2 Saturation: 91 %
Patient temperature: 38.3
Potassium: 4 mmol/L (ref 3.5–5.1)
Sodium: 141 mmol/L (ref 135–145)
TCO2: 23 mmol/L (ref 22–32)
pCO2 arterial: 39.2 mmHg (ref 32–48)
pH, Arterial: 7.369 (ref 7.35–7.45)
pO2, Arterial: 68 mmHg — ABNORMAL LOW (ref 83–108)

## 2024-03-16 LAB — HIV ANTIBODY (ROUTINE TESTING W REFLEX): HIV Screen 4th Generation wRfx: NONREACTIVE

## 2024-03-16 LAB — GLUCOSE, CAPILLARY
Glucose-Capillary: 155 mg/dL — ABNORMAL HIGH (ref 70–99)
Glucose-Capillary: 151 mg/dL — ABNORMAL HIGH (ref 70–99)
Glucose-Capillary: 155 mg/dL — ABNORMAL HIGH (ref 70–99)
Glucose-Capillary: 125 mg/dL — ABNORMAL HIGH (ref 70–99)
Glucose-Capillary: 116 mg/dL — ABNORMAL HIGH (ref 70–99)
Glucose-Capillary: 114 mg/dL — ABNORMAL HIGH (ref 70–99)

## 2024-03-16 LAB — MAGNESIUM
Magnesium: 2.2 mg/dL (ref 1.7–2.4)
Magnesium: 2.3 mg/dL (ref 1.7–2.4)

## 2024-03-16 LAB — PROTIME-INR
INR: 1.2 (ref 0.8–1.2)
Prothrombin Time: 16 s — ABNORMAL HIGH (ref 11.4–15.2)

## 2024-03-16 MED ORDER — ENOXAPARIN SODIUM 40 MG/0.4ML IJ SOSY
40.0000 mg | PREFILLED_SYRINGE | Freq: Every day | INTRAMUSCULAR | Status: DC
  Administered 2024-03-16 – 2024-03-19 (×4): 40 mg via SUBCUTANEOUS
  Filled 2024-03-16 (×4): qty 0.4

## 2024-03-16 MED ORDER — OXYCODONE HCL 5 MG PO TABS: 2.5000 mg | ORAL_TABLET | ORAL | Status: DC | PRN

## 2024-03-16 MED ORDER — WARFARIN SODIUM 2.5 MG PO TABS
2.5000 mg | ORAL_TABLET | Freq: Once | ORAL | Status: AC
  Administered 2024-03-16: 2.5 mg via ORAL
  Filled 2024-03-16: qty 1

## 2024-03-16 MED ORDER — WARFARIN - PHYSICIAN DOSING INPATIENT: Freq: Every day | Status: DC

## 2024-03-16 MED FILL — Sodium Bicarbonate IV Soln 8.4%: INTRAVENOUS | Qty: 50 | Status: AC

## 2024-03-16 MED FILL — Heparin Sodium (Porcine) Inj 1000 Unit/ML: Qty: 1000 | Status: AC

## 2024-03-16 MED FILL — Electrolyte-R (PH 7.4) Solution: INTRAVENOUS | Qty: 4000 | Status: AC

## 2024-03-16 MED FILL — Heparin Sodium (Porcine) Inj 1000 Unit/ML: INTRAMUSCULAR | Qty: 10 | Status: AC

## 2024-03-16 MED FILL — Mannitol IV Soln 20%: INTRAVENOUS | Qty: 500 | Status: AC

## 2024-03-16 MED FILL — Sodium Chloride IV Soln 0.9%: INTRAVENOUS | Qty: 3000 | Status: AC

## 2024-03-16 MED FILL — Calcium Chloride Inj 10%: INTRAVENOUS | Qty: 10 | Status: AC

## 2024-03-16 MED FILL — Lidocaine HCl Local Preservative Free (PF) Inj 2%: INTRAMUSCULAR | Qty: 14 | Status: AC

## 2024-03-16 MED FILL — Potassium Chloride Inj 2 mEq/ML: INTRAVENOUS | Qty: 40 | Status: AC

## 2024-03-16 NOTE — Progress Notes (Signed)
 "  NAME:  Dustin [name], MRN:  [id number], DOB:  01-09-1981, LOS: 1 ADMISSION DATE:  03/15/2024, CONSULTATION DATE:  12/22 REFERRING MD:  Shyrl, CHIEF COMPLAINT:  post operative ICU medical support    History of Present Illness:  43 year old male patient with known history of bicuspid aortic valve with associated aortic stenosis and regurgitation underwent surgical consultation initially on 12/12 as he was starting to develop exertional dyspnea in addition to lower extremity swelling.  Preoperative cardiac catheterization conducted on 12/5.  Right heart cath pulmonary artery pressure 45/24 with mean 34 mmHg.  Pulmonary capillary wedge pressure 27 cardiac index 2.3.  WHO group 2 PAH.  Left heart cath normal coronary coronary artery disease.  LVEDP 25 mmHg Presented for elective aortic valve replacement on 12/22  OR events Bypass time 2 hrs 9 min  Clamp time 1hr Blood products Cell saver 945 EBL 1225 Fluids 1.5 liter crystalloid    Pertinent  Medical History  Hyperlipidemia, hypertension.  Prior cholecystectomy Bicuspid aortic valve with aortic stenosis and regurgitation.  And 4 cm ascending aortic aneurysm Significant Hospital Events: Including procedures, antibiotic start and stop dates in addition to other pertinent events   12/22 admitted post Mechanical AVR 12/23 Patient improving, plan to remove Aline and foley cath, capping wires   Interim History / Subjective:  Drowsy this morning, alert and oriented x 4   Up to chair, following commands   Objective    Blood pressure (!) 137/100, pulse 94, temperature 99.4 F (37.4 C), temperature source Oral, resp. rate (!) 22, height 6' (1.829 m), weight 115 kg, SpO2 93%. CVP:  [1 mmHg-24 mmHg] 3 mmHg CO:  [3.2 L/min-7.7 L/min] 6.6 L/min CI:  [1.3 L/min/m2-3.2 L/min/m2] 2.8 L/min/m2  Vent Mode: PSV;CPAP FiO2 (%):  [40 %-50 %] 40 % Set Rate:  [4 bmp-16 bmp] 4 bmp Vt Set:  [379 mL] 620 mL PEEP:  [5 cmH20] 5 cmH20 Pressure  Support:  [10 cmH20] 10 cmH20 Plateau Pressure:  [19 cmH20] 19 cmH20   Intake/Output Summary (Last 24 hours) at 03/16/2024 1452 Last data filed at 03/16/2024 1126 Gross per 24 hour  Intake 4732.65 ml  Output 3905 ml  Net 827.65 ml   Filed Weights   03/15/24 0956 03/16/24 0500  Weight: 118.8 kg 115 kg    Examination: General: pleasant adult male  HEENT: Normocephalic, PERRLA intact, Pink MM, teeth intact  CV: s1,s2, RRR, no MRG, No JVD, chest tubes- serosanguineous output, sternotomy, epicardial wires in place   pulm: clear, diminished, no distress Abs: bs active, soft  Extremities: no edema, no deformity, moves all extremities  Skin: no rash  Neuro: Rass 0, responds to painful stimuli, cough gag reflex present  GU: genital warts on scrotum  Resolved problem list  Post-op vent management   Assessment and Plan   Severe aortic insufficiency now status post aortic valve replacement 4 cm ascending aortic aneurysm P: Continue cardiac tele  Continue to monitor chest tube output, manage post op per TCTs  Continue epicardial wires Multimodal pain control- TCTs decreased  Transitioned off SSI  Starting Lovenox  per TCTs  Complete surgical abx  Remove aline, remove foley   Expected acute blood loss anemia  Baseline hgb: 14.9 Currently Hgb 12.7  P: Continue to trend CBC  Continue to monitor chest tube output   Genital Warts  Noted on scrotum  Family reported originally, upon exam- patient has multiple genital warts  P: Screen for STDs- HIV, HSV 1/2, GC F/u with labs  Labs   CBC: Recent Labs  Lab 03/11/24 0904 03/15/24 1244 03/15/24 1450 03/15/24 1455 03/15/24 1705 03/15/24 2019 03/15/24 2159 03/15/24 2200 03/15/24 2301 03/16/24 0415 03/16/24 0550  WBC 6.9  --   --   --  14.5*  --   --  13.2*  --  12.2*  --   HGB 15.0   < > 10.9*   < > 14.2   < > 13.3 13.3 10.9* 12.7* 12.2*  HCT 45.8   < > 32.7*   < > 43.1   < > 39.0 40.5 32.0* 38.7* 36.0*  MCV 79.9*  --    --   --  79.2*  --   --  79.4*  --  79.1*  --   PLT 207  --  165  --  172  --   --  199  --  183  --    < > = values in this interval not displayed.    Basic Metabolic Panel: Recent Labs  Lab 03/11/24 0904 03/15/24 1244 03/15/24 1413 03/15/24 1455 03/15/24 1514 03/15/24 1607 03/15/24 1610 03/15/24 2159 03/15/24 2200 03/15/24 2301 03/16/24 0415 03/16/24 0550  NA 139   < > 139   < > 135 138   < > 140 138 145 139 141  K 4.5   < > 5.0   < > 5.1 4.8   < > 4.6 4.4 3.4* 4.0 4.0  CL 106   < > 104  --  101 103  --   --  106  --  108  --   CO2 25  --   --   --   --   --   --   --  22  --  22  --   GLUCOSE 122*   < > 104*  --  130* 121*  --   --  149*  --  147*  --   BUN 19   < > 15  --  15 16  --   --  17  --  18  --   CREATININE 0.83   < > 0.70  --  0.80 0.80  --   --  0.96  --  0.87  --   CALCIUM  9.4  --   --   --   --   --   --   --  8.1*  --  7.9*  --   MG  --   --   --   --   --   --   --   --  3.0*  --  2.2  --    < > = values in this interval not displayed.   GFR: Estimated Creatinine Clearance: 141.9 mL/min (by C-G formula based on SCr of 0.87 mg/dL). Recent Labs  Lab 03/11/24 0904 03/15/24 1705 03/15/24 2200 03/16/24 0415  WBC 6.9 14.5* 13.2* 12.2*    Liver Function Tests: Recent Labs  Lab 03/11/24 0904  AST 20  ALT 15  ALKPHOS 75  BILITOT 0.5  PROT 7.6  ALBUMIN  4.4   No results for input(s): LIPASE, AMYLASE in the last 168 hours. No results for input(s): AMMONIA in the last 168 hours.  ABG    Component Value Date/Time   PHART 7.369 03/16/2024 0550   PCO2ART 39.2 03/16/2024 0550   PO2ART 68 (L) 03/16/2024 0550   HCO3 22.3 03/16/2024 0550   TCO2 23 03/16/2024 0550   ACIDBASEDEF 2.0 03/16/2024 0550   O2SAT 91 03/16/2024  0550     Coagulation Profile: Recent Labs  Lab 03/11/24 0904 03/15/24 1705 03/16/24 1015  INR 1.0 1.3* 1.2    Cardiac Enzymes: No results for input(s): CKTOTAL, CKMB, CKMBINDEX, TROPONINI in the last 168  hours.  HbA1C: Hgb A1c MFr Bld  Date/Time Value Ref Range Status  03/11/2024 09:04 AM 5.4 4.8 - 5.6 % Final    Comment:    (NOTE) Diagnosis of Diabetes The following HbA1c ranges recommended by the American Diabetes Association (ADA) may be used as an aid in the diagnosis of diabetes mellitus.  Hemoglobin             Suggested A1C NGSP%              Diagnosis  <5.7                   Non Diabetic  5.7-6.4                Pre-Diabetic  >6.4                   Diabetic  <7.0                   Glycemic control for                       adults with diabetes.      CBG: Recent Labs  Lab 03/15/24 2157 03/15/24 2259 03/16/24 0418 03/16/24 0547 03/16/24 1116  GLUCAP 145* 134* 155* 151* 155*    Review of Systems:   Not able   Past Medical History:  He,  has a past medical history of Heart murmur, Hyperlipidemia, and Hypertension.   Surgical History:   Past Surgical History:  Procedure Laterality Date   AORTIC VALVE REPLACEMENT N/A 03/15/2024   Procedure: REPLACEMENT, AORTIC VALVE, OPEN, USING ON-X VALVE SIZE 25 MM;  Surgeon: Shyrl Linnie KIDD, MD;  Location: [facility] OR;  Service: Open Heart Surgery;  Laterality: N/A;   CHOLECYSTECTOMY     INTRAOPERATIVE TRANSESOPHAGEAL ECHOCARDIOGRAM N/A 03/15/2024   Procedure: ECHOCARDIOGRAM, TRANSESOPHAGEAL, INTRAOPERATIVE;  Surgeon: Shyrl Linnie KIDD, MD;  Location: [facility] OR;  Service: Open Heart Surgery;  Laterality: N/A;   RIGHT/LEFT HEART CATH AND CORONARY ANGIOGRAPHY N/A 02/27/2024   Procedure: RIGHT/LEFT HEART CATH AND CORONARY ANGIOGRAPHY;  Surgeon: Elmira Newman PARAS, MD;  Location: [facility] INVASIVE CV LAB;  Service: Cardiovascular;  Laterality: N/A;   TRANSESOPHAGEAL ECHOCARDIOGRAM (CATH LAB) N/A 02/27/2024   Procedure: TRANSESOPHAGEAL ECHOCARDIOGRAM;  Surgeon: Kriste Emeline BRAVO, DO;  Location: [facility] INVASIVE CV LAB;  Service: Cardiovascular;  Laterality: N/A;     Social History:   reports that he quit smoking about 10 years ago. His  smoking use included cigarettes. He started smoking about 11 years ago. He has a 0.3 pack-year smoking history. He has never used smokeless tobacco. He reports that he does not currently use alcohol. He reports that he does not use drugs.   Family History:  His family history includes Diabetes in his brother, father, and sister; Heart attack in his father; Hypertension in his brother, mother, and sister.   Allergies Allergies[1]   Home Medications  Prior to Admission medications  Medication Sig Start Date End Date Taking? Authorizing Provider  atenolol  (TENORMIN ) 50 MG tablet TAKE 1 TABLET(50 MG) BY MOUTH DAILY Patient taking differently: Take 50 mg by mouth every evening. 04/29/23  Yes Patwardhan, Newman PARAS, MD    Total Time: 45 mins   Sherlean Sharps  AGACNP-BC   [facility] 03/16/2024, 3:23 PM  Please see [email or web address] for pager details.  From 7A-7P if no response, please call [phone]. After hours, please call ELink [phone].            [1] No Known Allergies  "

## 2024-03-16 NOTE — Telephone Encounter (Signed)
 Ordering echocardiogram to be performed after 6 weeks status post AVR.  Primary cardiologist and CT surgery attached to results.

## 2024-03-16 NOTE — TOC CM/SW Note (Signed)
 Transition of Care [facility]) - Inpatient Brief Assessment   Patient Details  Name: Dustin [name] MRN: [id number] Date of Birth: Aug 04, 1980  Transition of Care [facility]) CM/SW Contact:    Sudie Erminio Deems, RN Phone Number: 03/16/2024, 2:52 PM   Clinical Narrative: Patient POD-1 AVR. PTA patient was from home with spouse. Patient has insurance and PCP. ICM will continue to follow for disposition needs as the patient progresses.     Transition of Care Asessment: Insurance and Status: Insurance coverage has been reviewed Patient has primary care physician: Yes  Prior/Current Home Services: No current home services Social Drivers of Health Review: SDOH reviewed no interventions necessary Readmission risk has been reviewed: Yes Transition of care needs: no transition of care needs at this time

## 2024-03-16 NOTE — Progress Notes (Signed)
 TCTS Evening Rounds:  Hemodynamically stable in sinus rhythm.   Sats 93% 4L.   CT output low.   Coumadin  2.5 started tonight.  BMET pending this pm.

## 2024-03-16 NOTE — Progress Notes (Addendum)
 "     [address] Zone [place] [address]             [phone]         1 Day Post-Op Procedures (LRB): REPLACEMENT, AORTIC VALVE, OPEN, USING ON-X VALVE SIZE 25 MM (N/A) ECHOCARDIOGRAM, TRANSESOPHAGEAL, INTRAOPERATIVE (N/A)  Subjective:  Some pain. Sleepy after oxycodone . Denies N/V  Objective: Vital signs in last 24 hours: Temp:  [97.9 F (36.6 C)-102.2 F (39 C)] 100.2 F (37.9 C) (12/23 0700) Pulse Rate:  [58-99] 99 (12/23 0700) Cardiac Rhythm: Normal sinus rhythm (12/22 2000) Resp:  [14-33] 19 (12/23 0700) BP: (103-148)/(60-110) 140/88 (12/23 0700) SpO2:  [90 %-100 %] 93 % (12/23 0700) Arterial Line BP: (77-177)/(43-87) 140/78 (12/23 0700) FiO2 (%):  [40 %-50 %] 40 % (12/22 2136) Weight:  [115 kg-118.8 kg] 115 kg (12/23 0500)  Hemodynamic parameters for last 24 hours: CVP:  [1 mmHg-24 mmHg] 6 mmHg CO:  [3.2 L/min-7.7 L/min] 7.7 L/min CI:  [1.3 L/min/m2-3.2 L/min/m2] 3.2 L/min/m2  Intake/Output from previous day: 12/22 0701 - 12/23 0700 In: 5132.7 [P.O.:150; I.V.:2112.7; Blood:945; NG/GT:50; IV Piggyback:1875] Out: 4365 [Urine:2770; Emesis/NG output:100; Blood:1225; Chest Tube:270]  General appearance: alert, cooperative, and no distress Heart: regular rate and rhythm Lungs: diminished bilaterally Abdomen: soft, non-tender; bowel sounds normal; no masses,  no organomegaly Extremities: edema trace Wound: aquacel in place  Lab Results: Recent Labs    03/15/24 2200 03/15/24 2301 03/16/24 0415 03/16/24 0550  WBC 13.2*  --  12.2*  --   HGB 13.3   < > 12.7* 12.2*  HCT 40.5   < > 38.7* 36.0*  PLT 199  --  183  --    < > = values in this interval not displayed.   BMET:  Recent Labs    03/15/24 2200 03/15/24 2301 03/16/24 0415 03/16/24 0550  NA 138   < > 139 141  K 4.4   < > 4.0 4.0  CL 106  --  108  --   CO2 22  --  22  --   GLUCOSE 149*  --  147*  --   BUN 17  --  18  --   CREATININE 0.96  --  0.87  --   CALCIUM  8.1*  --   7.9*  --    < > = values in this interval not displayed.    PT/INR:  Recent Labs    03/15/24 1705  LABPROT 17.2*  INR 1.3*   ABG    Component Value Date/Time   PHART 7.369 03/16/2024 0550   HCO3 22.3 03/16/2024 0550   TCO2 23 03/16/2024 0550   ACIDBASEDEF 2.0 03/16/2024 0550   O2SAT 91 03/16/2024 0550   CBG (last 3)  Recent Labs    03/15/24 2259 03/16/24 0418 03/16/24 0547  GLUCAP 134* 155* 151*    Assessment/Plan: S/P Procedures (LRB): REPLACEMENT, AORTIC VALVE, OPEN, USING ON-X VALVE SIZE 25 MM (N/A) ECHOCARDIOGRAM, TRANSESOPHAGEAL, INTRAOPERATIVE (N/A)  CV- NSR, BP elevated in the 140s- will start low dose BB today Pulm- CT output 270 cc.. leave in place.. continue IS Renal-creatinine stable, minimal edema.. hold off on Lasix  today Expected post operative blood loss anemia- Hgb at 12.2 Dispo- patient stable POD #1 progression orders   LOS: 1 day    Rocky Shad, PA-C 03/16/2024 7:50 AM  Out of bed to chair this morning, seems lethargic after oxy 5 mg.  NSR, hemodynamics stable.  Will decrease oxy dosing, remove  A line, remove foley, cap pacing wires, start lovenox , aggressive PT and walking.  Chest tubes will stay for today.  Con Clunes, MD Cardiothoracic Surgery Pager: [phone]   "

## 2024-03-17 ENCOUNTER — Other Ambulatory Visit: Payer: Self-pay

## 2024-03-17 DIAGNOSIS — I351 Nonrheumatic aortic (valve) insufficiency: Secondary | ICD-10-CM | POA: Diagnosis not present

## 2024-03-17 DIAGNOSIS — A630 Anogenital (venereal) warts: Secondary | ICD-10-CM | POA: Diagnosis not present

## 2024-03-17 DIAGNOSIS — Z952 Presence of prosthetic heart valve: Secondary | ICD-10-CM | POA: Diagnosis not present

## 2024-03-17 DIAGNOSIS — I7121 Aneurysm of the ascending aorta, without rupture: Secondary | ICD-10-CM | POA: Diagnosis not present

## 2024-03-17 LAB — BASIC METABOLIC PANEL WITH GFR
Anion gap: 8 (ref 5–15)
BUN: 14 mg/dL (ref 6–20)
CO2: 30 mmol/L (ref 22–32)
Calcium: 9.1 mg/dL (ref 8.9–10.3)
Chloride: 98 mmol/L (ref 98–111)
Creatinine, Ser: 0.9 mg/dL (ref 0.61–1.24)
GFR, Estimated: >60 mL/min
Glucose, Bld: 131 mg/dL — ABNORMAL HIGH (ref 70–99)
Potassium: 4.3 mmol/L (ref 3.5–5.1)
Sodium: 135 mmol/L (ref 135–145)

## 2024-03-17 LAB — HSV 1/2 PCR (SURFACE)
HSV-1 DNA: NOT DETECTED
HSV-2 DNA: NOT DETECTED

## 2024-03-17 LAB — CBC
HCT: 37.4 % — ABNORMAL LOW (ref 39.0–52.0)
HCT: 37.9 % — ABNORMAL LOW (ref 39.0–52.0)
Hemoglobin: 12.3 g/dL — ABNORMAL LOW (ref 13.0–17.0)
Hemoglobin: 12.3 g/dL — ABNORMAL LOW (ref 13.0–17.0)
MCH: 25.7 pg — ABNORMAL LOW (ref 26.0–34.0)
MCH: 26.3 pg (ref 26.0–34.0)
MCHC: 32.9 g/dL (ref 30.0–36.0)
MCHC: 32.5 g/dL (ref 30.0–36.0)
MCV: 78.2 fL — ABNORMAL LOW (ref 80.0–100.0)
MCV: 81 fL (ref 80.0–100.0)
Platelets: 196 K/uL (ref 150–400)
Platelets: 199 K/uL (ref 150–400)
RBC: 4.78 MIL/uL (ref 4.22–5.81)
RBC: 4.68 MIL/uL (ref 4.22–5.81)
RDW: 13.2 % (ref 11.5–15.5)
RDW: 13.2 % (ref 11.5–15.5)
WBC: 12.7 K/uL — ABNORMAL HIGH (ref 4.0–10.5)
WBC: 11.4 K/uL — ABNORMAL HIGH (ref 4.0–10.5)
nRBC: 0 % (ref 0.0–0.2)
nRBC: 0 % (ref 0.0–0.2)

## 2024-03-17 LAB — GLUCOSE, CAPILLARY
Glucose-Capillary: 89 mg/dL (ref 70–99)
Glucose-Capillary: 120 mg/dL — ABNORMAL HIGH (ref 70–99)
Glucose-Capillary: 151 mg/dL — ABNORMAL HIGH (ref 70–99)
Glucose-Capillary: 154 mg/dL — ABNORMAL HIGH (ref 70–99)
Glucose-Capillary: 148 mg/dL — ABNORMAL HIGH (ref 70–99)
Glucose-Capillary: 125 mg/dL — ABNORMAL HIGH (ref 70–99)

## 2024-03-17 LAB — PROTIME-INR
INR: 1.2 (ref 0.8–1.2)
Prothrombin Time: 15.6 s — ABNORMAL HIGH (ref 11.4–15.2)

## 2024-03-17 LAB — SURGICAL PATHOLOGY

## 2024-03-17 MED ORDER — SODIUM CHLORIDE 0.9% FLUSH
3.0000 mL | Freq: Two times a day (BID) | INTRAVENOUS | Status: DC
  Administered 2024-03-17: 3 mL via INTRAVENOUS

## 2024-03-17 MED ORDER — CHLORHEXIDINE GLUCONATE CLOTH 2 % EX PADS
6.0000 | MEDICATED_PAD | Freq: Every day | CUTANEOUS | Status: DC
  Administered 2024-03-17: 6 via TOPICAL

## 2024-03-17 MED ORDER — FENTANYL CITRATE (PF) 50 MCG/ML IJ SOSY: 25.0000 ug | PREFILLED_SYRINGE | Freq: Once | INTRAMUSCULAR | Status: DC

## 2024-03-17 MED ORDER — ~~LOC~~ CARDIAC SURGERY, PATIENT & FAMILY EDUCATION: Freq: Once | Status: AC

## 2024-03-17 MED ORDER — FUROSEMIDE 40 MG PO TABS: 40.0000 mg | ORAL_TABLET | Freq: Every day | ORAL | Status: DC

## 2024-03-17 MED ORDER — FUROSEMIDE 10 MG/ML IJ SOLN
40.0000 mg | Freq: Once | INTRAMUSCULAR | Status: AC
  Administered 2024-03-17: 40 mg via INTRAVENOUS
  Filled 2024-03-17: qty 4

## 2024-03-17 MED ORDER — POTASSIUM CHLORIDE CRYS ER 20 MEQ PO TBCR
40.0000 meq | EXTENDED_RELEASE_TABLET | Freq: Every day | ORAL | Status: DC
  Administered 2024-03-17 – 2024-03-20 (×4): 40 meq via ORAL
  Filled 2024-03-17 (×4): qty 2

## 2024-03-17 MED ORDER — INSULIN ASPART 100 UNIT/ML IJ SOLN
0.0000 [IU] | Freq: Three times a day (TID) | INTRAMUSCULAR | Status: DC
  Administered 2024-03-17 (×3): 2 [IU] via SUBCUTANEOUS
  Filled 2024-03-17: qty 1
  Filled 2024-03-17 (×2): qty 2

## 2024-03-17 MED ORDER — SODIUM CHLORIDE 0.9% FLUSH: 3.0000 mL | INTRAVENOUS | Status: DC | PRN

## 2024-03-17 MED ORDER — WARFARIN SODIUM 5 MG PO TABS
5.0000 mg | ORAL_TABLET | Freq: Every day | ORAL | Status: AC
  Administered 2024-03-17: 5 mg via ORAL
  Filled 2024-03-17: qty 1

## 2024-03-17 MED ORDER — METOPROLOL TARTRATE 25 MG PO TABS
25.0000 mg | ORAL_TABLET | Freq: Two times a day (BID) | ORAL | Status: DC
  Administered 2024-03-17 (×2): 25 mg via ORAL
  Filled 2024-03-17 (×2): qty 1

## 2024-03-17 MED ORDER — SODIUM CHLORIDE 0.9 % IV SOLN: 250.0000 mL | INTRAVENOUS | Status: DC | PRN

## 2024-03-17 MED ORDER — ALPRAZOLAM 0.5 MG PO TABS: 0.5000 mg | ORAL_TABLET | Freq: Three times a day (TID) | ORAL | Status: DC | PRN

## 2024-03-17 NOTE — Progress Notes (Addendum)
" ° °   °  [address] Zone [place] [address]             [phone]    RN called and reported the patient developed chest pain, dizziness and nausea. She reports the family member in the room is also his PCP and was requesting transfer back to the ICU. She reports the patient's vital signs were stable with his last systolic BP in the 130s and ST, HR 109. When she went back into the room the patient was walking to the bathroom independently. He also refused pain medication and Zofran . I spoke to the family member and he reports the patient would feel more comfortable in the ICU. I discussed with him that we cannot transfer back to the ICU unless there is a clinical need for it and at this time there was not. The family member finally said he felt it was more psychological than anything. I discussed that we could try xanax  and the family member was agreeable with that. I also ordered a stat EKG and CBC. EKG showed sinus tachycardia with left ventricular hypertrophy stable from last EKG.   Con GORMAN Bend, PA-C 03/17/24 "

## 2024-03-17 NOTE — Progress Notes (Signed)
 Patient asked about something to make himpoop. Informed patient that he is getting a couple of stool softeners. Patient acknowledged.  Patient also c/o not being able to bring anything up. Advised patient to use IS. It will make him cough and hopefully he will be able to cough the phlegm up.

## 2024-03-17 NOTE — Progress Notes (Signed)
 NT called to say that family wanted pt to go back to ICU and he was not doing well.  Pt sitting on side of bed when I entered room with family at bedside. Per pt personal PCP he is having chest pain, dizziness and does not feel well. VSS. Did not want morphine  for chest pain and did not want to lay down for dizziness. Wanted physician called. Paged PA Con and will make aware. Pt will get zofran  for nausea. Primary RN at bedside.SABRA Silversmith, Nena Hoard, RN

## 2024-03-17 NOTE — Progress Notes (Signed)
 Encouraged patient to use IS to help open his lungs and to get off of oxygen. Informed patient that he need to be weaned off of oxygen. If he can not then he might go home wiith oxygen. Patient understood.

## 2024-03-17 NOTE — Progress Notes (Signed)
 "  NAME:  Dustin [name], MRN:  [id number], DOB:  [birth year], LOS: 2 ADMISSION DATE:  03/15/2024, CONSULTATION DATE:  12/22 REFERRING MD:  Shyrl, CHIEF COMPLAINT:  post operative ICU medical support    History of Present Illness:  43 year old male patient with known history of bicuspid aortic valve with associated aortic stenosis and regurgitation underwent surgical consultation initially on 12/12 as he was starting to develop exertional dyspnea in addition to lower extremity swelling.  Preoperative cardiac catheterization conducted on 12/5.  Right heart cath pulmonary artery pressure 45/24 with mean 34 mmHg.  Pulmonary capillary wedge pressure 27 cardiac index 2.3.  WHO group 2 PAH.  Left heart cath normal coronary coronary artery disease.  LVEDP 25 mmHg Presented for elective aortic valve replacement on 12/22  OR events Bypass time 2 hrs 9 min  Clamp time 1hr Blood products Cell saver 945 EBL 1225 Fluids 1.5 liter crystalloid    Pertinent  Medical History  Hyperlipidemia, hypertension.  Prior cholecystectomy Bicuspid aortic valve with aortic stenosis and regurgitation.  And 4 cm ascending aortic aneurysm Significant Hospital Events: Including procedures, antibiotic start and stop dates in addition to other pertinent events   12/22 admitted post Mechanical AVR 12/23 Patient improving, plan to remove Aline and foley cath, capping wires  12/24 Patient improving, removed epicardial wires and CVC, plan to remove chest tubes later today and transfer out of ICU   Interim History / Subjective:  Patient pleasant, following commands  Has walked to unit today, with no issues   Chest tube output minimal   Objective    Blood pressure (!) 144/90, pulse (!) 116, temperature 98.9 F (37.2 C), temperature source Oral, resp. rate (!) 28, height 6' (1.829 m), weight 117.7 kg, SpO2 95%. CVP:  [3 mmHg-17 mmHg] 12 mmHg      Intake/Output Summary (Last 24 hours) at 03/17/2024 9075 Last  data filed at 03/17/2024 0700 Gross per 24 hour  Intake 500.34 ml  Output 1175 ml  Net -674.66 ml   Filed Weights   03/15/24 0956 03/16/24 0500 03/17/24 0500  Weight: 118.8 kg 115 kg 117.7 kg    Examination: General: pleasant adult male, lying in bed in NAD HEENT: Normocephalic, PERRLA intact, Pink MM, teeth intact CV: s1,s2, RRR-sinus tach, no MRG, No JVD  pulm: clear, diminished, no distress on vent  Abs: bs active, soft  Extremities: no edema, no deformity, moves all extremities on command  Skin: see GU, genital warts  Neuro: Rass 0, alert and oriented x 4 GU: genital warts on scrotum, no erythema or open sores   Resolved problem list  Post-op vent management   Assessment and Plan   Severe aortic insufficiency now status post aortic valve replacement 4 cm ascending aortic aneurysm P: Continue cardiac tele  Chest tubes will be removed prior to transfer  Epicardial wires/CVC already removed, no issues Continue post op care per TCTs Continue multimodal pain control  Continue SSI Continue lovenox  per TCTs Continue surgical abx  Continue pulm hygiene, continue mobilization  Follow up with CBC/BMET for today   Expected acute blood loss anemia  Baseline hgb: 14.9 Currently Hgb 13.1 P: Continue to trend CBC Continue to monitor signs of bleeding Transfuse for hgb < 7   Genital Warts  Noted on scrotum  Family reported originally, upon exam- patient has multiple genital warts  Screening for Screen for STDs- HIV, HSV 1/2, GC> HIV negative P: F/u with HSV, and GC  -GC sample needs to be collected, discussed  with RN   Labs   CBC: Recent Labs  Lab 03/11/24 0904 03/15/24 1244 03/15/24 1450 03/15/24 1455 03/15/24 1705 03/15/24 2019 03/15/24 2200 03/15/24 2301 03/16/24 0415 03/16/24 0550 03/16/24 1646  WBC 6.9  --   --   --  14.5*  --  13.2*  --  12.2*  --  13.6*  HGB 15.0   < > 10.9*   < > 14.2   < > 13.3 10.9* 12.7* 12.2* 13.1  HCT 45.8   < > 32.7*   < >  43.1   < > 40.5 32.0* 38.7* 36.0* 40.4  MCV 79.9*  --   --   --  79.2*  --  79.4*  --  79.1*  --  79.7*  PLT 207  --  165  --  172  --  199  --  183  --  186   < > = values in this interval not displayed.    Basic Metabolic Panel: Recent Labs  Lab 03/11/24 0904 03/15/24 1244 03/15/24 1514 03/15/24 1607 03/15/24 1610 03/15/24 2200 03/15/24 2301 03/16/24 0415 03/16/24 0550 03/16/24 1646  NA 139   < > 135 138   < > 138 145 139 141 137  K 4.5   < > 5.1 4.8   < > 4.4 3.4* 4.0 4.0 4.3  CL 106   < > 101 103  --  106  --  108  --  103  CO2 25  --   --   --   --  22  --  22  --  27  GLUCOSE 122*   < > 130* 121*  --  149*  --  147*  --  117*  BUN 19   < > 15 16  --  17  --  18  --  19  CREATININE 0.83   < > 0.80 0.80  --  0.96  --  0.87  --  0.89  CALCIUM  9.4  --   --   --   --  8.1*  --  7.9*  --  8.7*  MG  --   --   --   --   --  3.0*  --  2.2  --  2.3   < > = values in this interval not displayed.   GFR: Estimated Creatinine Clearance: 140.2 mL/min (by C-G formula based on SCr of 0.89 mg/dL). Recent Labs  Lab 03/15/24 1705 03/15/24 2200 03/16/24 0415 03/16/24 1646  WBC 14.5* 13.2* 12.2* 13.6*    Liver Function Tests: Recent Labs  Lab 03/11/24 0904  AST 20  ALT 15  ALKPHOS 75  BILITOT 0.5  PROT 7.6  ALBUMIN  4.4   No results for input(s): LIPASE, AMYLASE in the last 168 hours. No results for input(s): AMMONIA in the last 168 hours.  ABG    Component Value Date/Time   PHART 7.369 03/16/2024 0550   PCO2ART 39.2 03/16/2024 0550   PO2ART 68 (L) 03/16/2024 0550   HCO3 22.3 03/16/2024 0550   TCO2 23 03/16/2024 0550   ACIDBASEDEF 2.0 03/16/2024 0550   O2SAT 91 03/16/2024 0550     Coagulation Profile: Recent Labs  Lab 03/11/24 0904 03/15/24 1705 03/16/24 1015 03/17/24 0545  INR 1.0 1.3* 1.2 1.2    Cardiac Enzymes: No results for input(s): CKTOTAL, CKMB, CKMBINDEX, TROPONINI in the last 168 hours.  HbA1C: Hgb A1c MFr Bld  Date/Time Value  Ref Range Status  03/11/2024 09:04 AM 5.4 4.8 - 5.6 %  Final    Comment:    (NOTE) Diagnosis of Diabetes The following HbA1c ranges recommended by the American Diabetes Association (ADA) may be used as an aid in the diagnosis of diabetes mellitus.  Hemoglobin             Suggested A1C NGSP%              Diagnosis  <5.7                   Non Diabetic  5.7-6.4                Pre-Diabetic  >6.4                   Diabetic  <7.0                   Glycemic control for                       adults with diabetes.      CBG: Recent Labs  Lab 03/16/24 1516 03/16/24 1946 03/16/24 2311 03/17/24 0319 03/17/24 0733  GLUCAP 125* 116* 114* 120* 151*    Review of Systems:   Not able   Past Medical History:  He,  has a past medical history of Heart murmur, Hyperlipidemia, and Hypertension.   Surgical History:   Past Surgical History:  Procedure Laterality Date   AORTIC VALVE REPLACEMENT N/A 03/15/2024   Procedure: REPLACEMENT, AORTIC VALVE, OPEN, USING ON-X VALVE SIZE 25 MM;  Surgeon: Shyrl Linnie KIDD, MD;  Location: [facility] OR;  Service: Open Heart Surgery;  Laterality: N/A;   CHOLECYSTECTOMY     INTRAOPERATIVE TRANSESOPHAGEAL ECHOCARDIOGRAM N/A 03/15/2024   Procedure: ECHOCARDIOGRAM, TRANSESOPHAGEAL, INTRAOPERATIVE;  Surgeon: Shyrl Linnie KIDD, MD;  Location: [facility] OR;  Service: Open Heart Surgery;  Laterality: N/A;   RIGHT/LEFT HEART CATH AND CORONARY ANGIOGRAPHY N/A 02/27/2024   Procedure: RIGHT/LEFT HEART CATH AND CORONARY ANGIOGRAPHY;  Surgeon: Elmira Newman PARAS, MD;  Location: [facility] INVASIVE CV LAB;  Service: Cardiovascular;  Laterality: N/A;   TRANSESOPHAGEAL ECHOCARDIOGRAM (CATH LAB) N/A 02/27/2024   Procedure: TRANSESOPHAGEAL ECHOCARDIOGRAM;  Surgeon: Kriste Emeline BRAVO, DO;  Location: [facility] INVASIVE CV LAB;  Service: Cardiovascular;  Laterality: N/A;     Social History:   reports that he quit smoking about 10 years ago. His smoking use included cigarettes. He started smoking  about 11 years ago. He has a 0.3 pack-year smoking history. He has never used smokeless tobacco. He reports that he does not currently use alcohol. He reports that he does not use drugs.   Family History:  His family history includes Diabetes in his brother, father, and sister; Heart attack in his father; Hypertension in his brother, mother, and sister.   Allergies Allergies[1]   Home Medications  Prior to Admission medications  Medication Sig Start Date End Date Taking? Authorizing Provider  atenolol  (TENORMIN ) 50 MG tablet TAKE 1 TABLET(50 MG) BY MOUTH DAILY Patient taking differently: Take 50 mg by mouth every evening. 04/29/23  Yes Patwardhan, Newman PARAS, MD    Total Time: 40 mins   Sherlean Sharps AGACNP-BC   [facility] 03/17/2024, 9:24 AM  Please see [email or web address] for pager details.  From 7A-7P if no response, please call [phone]. After hours, please call ELink [phone].             [1] No Known Allergies  "

## 2024-03-17 NOTE — Plan of Care (Signed)
   Problem: Activity: Goal: Risk for activity intolerance will decrease Outcome: Progressing

## 2024-03-17 NOTE — Progress Notes (Signed)
" ° °   °  [address] [place] [address]             [phone]                2 Days Post-Op Procedures (LRB): REPLACEMENT, AORTIC VALVE, OPEN, USING ON-X VALVE SIZE 25 MM (N/A) ECHOCARDIOGRAM, TRANSESOPHAGEAL, INTRAOPERATIVE (N/A)   Events: No events _______________________________________________________________ Vitals: BP (!) 137/99   Pulse (!) 105   Temp 98.9 F (37.2 C) (Oral)   Resp 20   Ht 6' (1.829 m)   Wt 117.7 kg   SpO2 95%   BMI 35.19 kg/m  Filed Weights   03/15/24 0956 03/16/24 0500 03/17/24 0500  Weight: 118.8 kg 115 kg 117.7 kg     - Neuro: alert NAD  - Cardiovascular: sinus tach  Drips: none.   CVP:  [3 mmHg-17 mmHg] 12 mmHg  - Pulm: EWOB    ABG    Component Value Date/Time   PHART 7.369 03/16/2024 0550   PCO2ART 39.2 03/16/2024 0550   PO2ART 68 (L) 03/16/2024 0550   HCO3 22.3 03/16/2024 0550   TCO2 23 03/16/2024 0550   ACIDBASEDEF 2.0 03/16/2024 0550   O2SAT 91 03/16/2024 0550    - Abd: ND - Extremity: trace edema  .Intake/Output      12/23 0701 12/24 0700 12/24 0701 12/25 0700   P.O.     I.V. (mL/kg)     Blood     NG/GT     IV Piggyback 400.3    Total Intake(mL/kg) 400.3 (3.4)    Urine (mL/kg/hr) 1025 (0.4)    Emesis/NG output     Blood     Chest Tube 180    Total Output 1205    Net -804.7            _______________________________________________________________ Labs:    Latest Ref Rng & Units 03/16/2024    4:46 PM 03/16/2024    5:50 AM 03/16/2024    4:15 AM  CBC  WBC 4.0 - 10.5 K/uL 13.6   12.2   Hemoglobin 13.0 - 17.0 g/dL 86.8  87.7  87.2   Hematocrit 39.0 - 52.0 % 40.4  36.0  38.7   Platelets 150 - 400 K/uL 186   183       Latest Ref Rng & Units 03/16/2024    4:46 PM 03/16/2024    5:50 AM 03/16/2024    4:15 AM  CMP  Glucose 70 - 99 mg/dL 882   852   BUN 6 - 20 mg/dL 19   18   Creatinine 9.38 - 1.24 mg/dL 9.10   9.12   Sodium 864 - 145 mmol/L 137  141  139   Potassium 3.5 -  5.1 mmol/L 4.3  4.0  4.0   Chloride 98 - 111 mmol/L 103   108   CO2 22 - 32 mmol/L 27   22   Calcium  8.9 - 10.3 mg/dL 8.7   7.9     CXR: -  _______________________________________________________________  Assessment and Plan: POD 2 s/p mAVR  Neuro: pain controlled CV: increasing BB.  Will remove epicardials Pulm: IS, ambulation Renal: diuresing GI: on diet Heme: on coumadin  for valve ID: STD screen pending. Endo: SSI Dispo: floor   Josetta Wigal O Valetta Mulroy 03/17/2024 8:01 AM   "

## 2024-03-18 ENCOUNTER — Inpatient Hospital Stay (HOSPITAL_COMMUNITY)

## 2024-03-18 LAB — PROTIME-INR
INR: 1.4 — ABNORMAL HIGH (ref 0.8–1.2)
Prothrombin Time: 17.6 s — ABNORMAL HIGH (ref 11.4–15.2)

## 2024-03-18 LAB — HSV(HERPES SIMPLEX VRS) I + II AB-IGG
HSV 1 Glycoprotein G Ab, IgG: REACTIVE — AB
HSV 2 Glycoprotein G Ab, IgG: NONREACTIVE

## 2024-03-18 LAB — CBC
HCT: 35.7 % — ABNORMAL LOW (ref 39.0–52.0)
Hemoglobin: 11.5 g/dL — ABNORMAL LOW (ref 13.0–17.0)
MCH: 25.8 pg — ABNORMAL LOW (ref 26.0–34.0)
MCHC: 32.2 g/dL (ref 30.0–36.0)
MCV: 80 fL (ref 80.0–100.0)
Platelets: 173 K/uL (ref 150–400)
RBC: 4.46 MIL/uL (ref 4.22–5.81)
RDW: 13.2 % (ref 11.5–15.5)
WBC: 9.3 K/uL (ref 4.0–10.5)
nRBC: 0 % (ref 0.0–0.2)

## 2024-03-18 LAB — GLUCOSE, CAPILLARY
Glucose-Capillary: 113 mg/dL — ABNORMAL HIGH (ref 70–99)
Glucose-Capillary: 121 mg/dL — ABNORMAL HIGH (ref 70–99)
Glucose-Capillary: 135 mg/dL — ABNORMAL HIGH (ref 70–99)

## 2024-03-18 LAB — BASIC METABOLIC PANEL WITH GFR
Anion gap: 9 (ref 5–15)
BUN: 14 mg/dL (ref 6–20)
CO2: 29 mmol/L (ref 22–32)
Calcium: 8.9 mg/dL (ref 8.9–10.3)
Chloride: 100 mmol/L (ref 98–111)
Creatinine, Ser: 0.75 mg/dL (ref 0.61–1.24)
GFR, Estimated: >60 mL/min
Glucose, Bld: 117 mg/dL — ABNORMAL HIGH (ref 70–99)
Potassium: 4.2 mmol/L (ref 3.5–5.1)
Sodium: 138 mmol/L (ref 135–145)

## 2024-03-18 MED ORDER — LACTULOSE 10 GM/15ML PO SOLN: 20.0000 g | Freq: Two times a day (BID) | ORAL | Status: DC | PRN

## 2024-03-18 MED ORDER — METOPROLOL TARTRATE 50 MG PO TABS
50.0000 mg | ORAL_TABLET | Freq: Two times a day (BID) | ORAL | Status: DC
  Administered 2024-03-18 – 2024-03-20 (×5): 50 mg via ORAL
  Filled 2024-03-18 (×5): qty 1

## 2024-03-18 MED ORDER — VALACYCLOVIR HCL 500 MG PO TABS
1000.0000 mg | ORAL_TABLET | Freq: Two times a day (BID) | ORAL | Status: DC
  Administered 2024-03-18 – 2024-03-20 (×5): 1000 mg via ORAL
  Filled 2024-03-18 (×5): qty 2

## 2024-03-18 MED ORDER — GUAIFENESIN ER 600 MG PO TB12
600.0000 mg | ORAL_TABLET | Freq: Two times a day (BID) | ORAL | Status: DC
  Administered 2024-03-18 – 2024-03-20 (×5): 600 mg via ORAL
  Filled 2024-03-18 (×5): qty 1

## 2024-03-18 MED ORDER — FUROSEMIDE 40 MG PO TABS
40.0000 mg | ORAL_TABLET | Freq: Two times a day (BID) | ORAL | Status: DC
  Administered 2024-03-18 (×2): 40 mg via ORAL
  Filled 2024-03-18 (×2): qty 1

## 2024-03-18 MED ORDER — WARFARIN SODIUM 5 MG PO TABS
5.0000 mg | ORAL_TABLET | Freq: Once | ORAL | Status: AC
  Administered 2024-03-18: 5 mg via ORAL
  Filled 2024-03-18: qty 1

## 2024-03-18 MED ORDER — BISACODYL 10 MG RE SUPP: 10.0000 mg | Freq: Every day | RECTAL | Status: DC | PRN

## 2024-03-18 MED ORDER — BISACODYL 5 MG PO TBEC
10.0000 mg | DELAYED_RELEASE_TABLET | Freq: Every day | ORAL | Status: DC
  Administered 2024-03-18: 10 mg via ORAL
  Filled 2024-03-18: qty 2

## 2024-03-18 MED ORDER — LACTULOSE 10 GM/15ML PO SOLN
20.0000 g | Freq: Once | ORAL | Status: AC
  Administered 2024-03-18: 20 g via ORAL
  Filled 2024-03-18: qty 30

## 2024-03-18 NOTE — Progress Notes (Addendum)
 "     [address] Zone [place] [address]             [phone]      3 Days Post-Op Procedures (LRB): REPLACEMENT, AORTIC VALVE, OPEN, USING ON-X VALVE SIZE 25 MM (N/A) ECHOCARDIOGRAM, TRANSESOPHAGEAL, INTRAOPERATIVE (N/A) Subjective: Patient reports he has little pain but he was coughing a lot yesterday and feels like something is getting stuck.   Objective: Vital signs in last 24 hours: Temp:  [98 F (36.7 C)-99.1 F (37.3 C)] 98.2 F (36.8 C) (12/25 0321) Pulse Rate:  [93-110] 105 (12/25 0629) Cardiac Rhythm: Sinus tachycardia (12/24 2300) Resp:  [17-28] 19 (12/25 0629) BP: (113-164)/(92-105) 151/103 (12/25 0629) SpO2:  [93 %-98 %] 94 % (12/25 0629) Weight:  [115.2 kg-117.4 kg] 115.2 kg (12/25 0629)  Hemodynamic parameters for last 24 hours:    Intake/Output from previous day: 12/24 0701 - 12/25 0700 In: 0  Out: 1400 [Urine:1400] Intake/Output this shift: No intake/output data recorded.  General appearance: alert, cooperative, and no distress Neurologic: intact Heart: sinus tachycardia, no murmur, crisp valve click Lungs: diminished bibasilar breath sounds Abdomen: minimal distension, +BS, nontender Extremities: edema trace BLE Wound: Clean and dry dressing in place  Lab Results: Recent Labs    03/17/24 1742 03/18/24 0348  WBC 11.4* 9.3  HGB 12.3* 11.5*  HCT 37.9* 35.7*  PLT 199 173   BMET:  Recent Labs    03/17/24 1334 03/18/24 0348  NA 135 138  K 4.3 4.2  CL 98 100  CO2 30 29  GLUCOSE 131* 117*  BUN 14 14  CREATININE 0.90 0.75  CALCIUM  9.1 8.9    PT/INR:  Recent Labs    03/18/24 0348  LABPROT 17.6*  INR 1.4*   ABG    Component Value Date/Time   PHART 7.369 03/16/2024 0550   HCO3 22.3 03/16/2024 0550   TCO2 23 03/16/2024 0550   ACIDBASEDEF 2.0 03/16/2024 0550   O2SAT 91 03/16/2024 0550   CBG (last 3)  Recent Labs    03/17/24 1622 03/17/24 2133 03/18/24 0626  GLUCAP 148* 125* 113*     Assessment/Plan: S/P Procedures (LRB): REPLACEMENT, AORTIC VALVE, OPEN, USING ON-X VALVE SIZE 25 MM (N/A) ECHOCARDIOGRAM, TRANSESOPHAGEAL, INTRAOPERATIVE (N/A)  Neuro: Patient's cousin/PCP reports he felt the patient was anxious yesterday causing his dizziness, nausea and chest pain. Xanax  ordered but not given. Patient denies more episodes like this.   CV: Stable EKG with left ventricular hypertrophy. ST, HR 105-120s. SBP mostly 130s but up into the 150s at times. Will titrate Lopressor  to 50mg  BID. On Coumadin  for mechanical valve.   Pulm: Saturating well on 3L [place], turned down to 2L while in the room without desaturation. CXR with low volume, likely vascular congestion and bibasilar atelectasis. Having difficulty coughing up mucus, will add mucinex  and flutter valve. Encourage IS and ambulation. Wean O2 as able for SpO2>90%  GI: -BM, tolerating a diet. Some nausea and a little distension. Will give lactulose  and suppository prn if lactulose  does not work. Continue Zofran  prn for nausea.   Endo: No hx of DM, preop A1C 5.4. Will d/c SSI and CBGs.  Renal: Cr 0.75, stable. UO 1400cc/24hrs recorded. At preop weight? K 4.2, at goal. Received IV Lasix  40mg  yesterday, will give PO Lasix  40mg  BID with pulmonary congestion and still on oxygen.   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 As discussed with pharmacy will treat with Valtrex  for  10 days.   ID: Likely reactive leukocytosis resolved, Tmax 99.1. No sign of infection.   Expected postop ABLA: H/H 11.3/35.7, slightly decreased from yesterday. Not clinically significant at this time.   DVT Prophylaxis: On Lovenox  and Coumadin , will d/c lovenox  once closer to goal INR  Anticoagulation: INR goal 2-3. INR 1.2>1.2>1.4 after 2.5mg >5mg  Coumadin  yesterday. Will continue 5mg  Coumadin .   Dispo: Hopefully if we can wean O2 and get INR to goal can d/c home 48-72 hours.    LOS: 3 days    Con GORMAN Bend, PA-C 03/18/2024  Agree Continue PT/OT INR 1.4  Awad Gladd O Masin Shatto    "

## 2024-03-19 LAB — BASIC METABOLIC PANEL WITH GFR
Anion gap: 11 (ref 5–15)
BUN: 15 mg/dL (ref 6–20)
CO2: 28 mmol/L (ref 22–32)
Calcium: 9.1 mg/dL (ref 8.9–10.3)
Chloride: 97 mmol/L — ABNORMAL LOW (ref 98–111)
Creatinine, Ser: 0.8 mg/dL (ref 0.61–1.24)
GFR, Estimated: >60 mL/min
Glucose, Bld: 128 mg/dL — ABNORMAL HIGH (ref 70–99)
Potassium: 3.9 mmol/L (ref 3.5–5.1)
Sodium: 135 mmol/L (ref 135–145)

## 2024-03-19 LAB — CBC
HCT: 37.6 % — ABNORMAL LOW (ref 39.0–52.0)
Hemoglobin: 12.2 g/dL — ABNORMAL LOW (ref 13.0–17.0)
MCH: 25.6 pg — ABNORMAL LOW (ref 26.0–34.0)
MCHC: 32.4 g/dL (ref 30.0–36.0)
MCV: 79 fL — ABNORMAL LOW (ref 80.0–100.0)
Platelets: 261 K/uL (ref 150–400)
RBC: 4.76 MIL/uL (ref 4.22–5.81)
RDW: 13 % (ref 11.5–15.5)
WBC: 9.6 K/uL (ref 4.0–10.5)
nRBC: 0 % (ref 0.0–0.2)

## 2024-03-19 LAB — GC/CHLAMYDIA PROBE AMP (~~LOC~~) NOT AT ARMC
Chlamydia: NEGATIVE
Comment: NEGATIVE
Comment: NORMAL
Neisseria Gonorrhea: NEGATIVE

## 2024-03-19 LAB — PROTIME-INR
INR: 1.7 — ABNORMAL HIGH (ref 0.8–1.2)
Prothrombin Time: 21.1 s — ABNORMAL HIGH (ref 11.4–15.2)

## 2024-03-19 MED ORDER — TORSEMIDE 20 MG PO TABS
20.0000 mg | ORAL_TABLET | Freq: Every day | ORAL | Status: DC
  Filled 2024-03-19: qty 1

## 2024-03-19 MED ORDER — ASPIRIN 81 MG PO CHEW: 81.0000 mg | CHEWABLE_TABLET | Freq: Every day | ORAL | Status: DC

## 2024-03-19 MED ORDER — FUROSEMIDE 40 MG PO TABS: 40.0000 mg | ORAL_TABLET | Freq: Every day | ORAL | Status: DC

## 2024-03-19 MED ORDER — ASPIRIN 81 MG PO TBEC
81.0000 mg | DELAYED_RELEASE_TABLET | Freq: Every day | ORAL | Status: DC
  Administered 2024-03-19 – 2024-03-20 (×2): 81 mg via ORAL
  Filled 2024-03-19 (×2): qty 1

## 2024-03-19 MED ORDER — TORSEMIDE 20 MG PO TABS
20.0000 mg | ORAL_TABLET | Freq: Every day | ORAL | Status: DC
  Administered 2024-03-19 – 2024-03-20 (×2): 20 mg via ORAL
  Filled 2024-03-19: qty 1

## 2024-03-19 MED ORDER — WARFARIN SODIUM 5 MG PO TABS
5.0000 mg | ORAL_TABLET | Freq: Once | ORAL | Status: AC
  Administered 2024-03-19: 5 mg via ORAL
  Filled 2024-03-19: qty 1

## 2024-03-19 NOTE — Plan of Care (Signed)

## 2024-03-19 NOTE — Progress Notes (Addendum)
" ° °   °  [address] Zone [place] [address]             [phone]         4 Days Post-Op Procedures (LRB): REPLACEMENT, AORTIC VALVE, OPEN, USING ON-X VALVE SIZE 25 MM (N/A) ECHOCARDIOGRAM, TRANSESOPHAGEAL, INTRAOPERATIVE (N/A)  Subjective:  Patient sitting up in chair.  States he can't sleep.  He states his cough is improved.  He has moved his bowels  Objective: Vital signs in last 24 hours: Temp:  [98.2 F (36.8 C)-100.4 F (38 C)] 99.4 F (37.4 C) (12/26 0424) Pulse Rate:  [92-114] 106 (12/26 0424) Cardiac Rhythm: Sinus tachycardia (12/25 1900) Resp:  [17-21] 20 (12/26 0424) BP: (120-149)/(88-105) 120/88 (12/26 0424) SpO2:  [91 %-96 %] 92 % (12/26 0424) Weight:  [114.8 kg] 114.8 kg (12/26 0424)  General appearance: alert, cooperative, and no distress Heart: regular rate and rhythm Lungs: clear to auscultation bilaterally Abdomen: soft, non-tender; bowel sounds normal; no masses,  no organomegaly Extremities: edema trace Wound: clean and dry  Lab Results: Recent Labs    03/18/24 0348 03/19/24 0436  WBC 9.3 9.6  HGB 11.5* 12.2*  HCT 35.7* 37.6*  PLT 173 261   BMET:  Recent Labs    03/18/24 0348 03/19/24 0436  NA 138 135  K 4.2 3.9  CL 100 97*  CO2 29 28  GLUCOSE 117* 128*  BUN 14 15  CREATININE 0.75 0.80  CALCIUM  8.9 9.1    PT/INR:  Recent Labs    03/19/24 0436  LABPROT 21.1*  INR 1.7*   ABG    Component Value Date/Time   PHART 7.369 03/16/2024 0550   HCO3 22.3 03/16/2024 0550   TCO2 23 03/16/2024 0550   ACIDBASEDEF 2.0 03/16/2024 0550   O2SAT 91 03/16/2024 0550   CBG (last 3)  Recent Labs    03/18/24 0626 03/18/24 1237 03/18/24 2150  GLUCAP 113* 121* 135*    Assessment/Plan: S/P Procedures (LRB): REPLACEMENT, AORTIC VALVE, OPEN, USING ON-X VALVE SIZE 25 MM (N/A) ECHOCARDIOGRAM, TRANSESOPHAGEAL, INTRAOPERATIVE (N/A)  CV- Sinus Tach, BP is elevated- on Lopressor  INR 1.7, will repeat coumadin  at 5 mg this  evening Pulm- no acute issues, cough improved.. off oxygen, continue IS Renal- creatinine stable, weight is below baseline.. 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

## 2024-03-19 NOTE — Progress Notes (Addendum)
 Pt up walking around the room independently. Post OHS education including sternal precautions, site care, restrictions, IS use, exercise guidelines, heart healthy diet, and CRP2 reviewed. All questions and concerns addressed. Will refer to [facility] for CRP2.   [phone]  Augustin KATHEE Sharps, RRT, BSRT 03/19/2024 0840 AM

## 2024-03-20 LAB — PROTIME-INR
INR: 2.2 — ABNORMAL HIGH (ref 0.8–1.2)
Prothrombin Time: 25.1 s — ABNORMAL HIGH (ref 11.4–15.2)

## 2024-03-20 MED ORDER — GUAIFENESIN ER 600 MG PO TB12: 600.0000 mg | ORAL_TABLET | Freq: Two times a day (BID) | ORAL | Status: AC | PRN

## 2024-03-20 MED ORDER — ACETAMINOPHEN 500 MG PO TABS: 1000.0000 mg | ORAL_TABLET | Freq: Four times a day (QID) | ORAL | Status: AC | PRN

## 2024-03-20 MED ORDER — WARFARIN SODIUM 5 MG PO TABS: 5.0000 mg | ORAL_TABLET | Freq: Every day | ORAL | 1 refills | Status: AC

## 2024-03-20 MED ORDER — METOPROLOL TARTRATE 50 MG PO TABS: 50.0000 mg | ORAL_TABLET | Freq: Two times a day (BID) | ORAL | 1 refills | Status: AC

## 2024-03-20 MED ORDER — VALACYCLOVIR HCL 1 G PO TABS: 1000.0000 mg | ORAL_TABLET | Freq: Two times a day (BID) | ORAL | 0 refills | Status: AC

## 2024-03-20 MED ORDER — ASPIRIN 81 MG PO TBEC: 81.0000 mg | DELAYED_RELEASE_TABLET | Freq: Every day | ORAL | Status: AC

## 2024-03-20 MED ORDER — TRAMADOL HCL 50 MG PO TABS: 50.0000 mg | ORAL_TABLET | Freq: Four times a day (QID) | ORAL | 0 refills | Status: AC | PRN

## 2024-03-20 NOTE — Progress Notes (Signed)
 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 (N/A) ECHOCARDIOGRAM, TRANSESOPHAGEAL, INTRAOPERATIVE (N/A) Subjective: Feels well  Objective: Vital signs in last 24 hours: Temp:  [98 F (36.7 C)-98.8 F (37.1 C)] 98.1 F (36.7 C) (12/27 0408) Pulse Rate:  [94-110] 105 (12/26 1939) Cardiac Rhythm: Sinus tachycardia (12/26 2341) Resp:  [15-21] 21 (12/27 0630) BP: (125-145)/(88-109) 145/101 (12/27 0408) SpO2:  [94 %-97 %] 96 % (12/26 2341) Weight:  [112.7 kg] 112.7 kg (12/27 0630)  Hemodynamic parameters for last 24 hours:    Intake/Output from previous day: 12/26 0701 - 12/27 0700 In: 360 [P.O.:360] Out: -  Intake/Output this shift: No intake/output data recorded.  General appearance: alert, cooperative, and no distress Heart: regular rate and rhythm Lungs: clear to auscultation bilaterally Abdomen: benign Extremities: benign Wound: incis healing   Lab Results: Recent Labs    03/18/24 0348 03/19/24 0436  WBC 9.3 9.6  HGB 11.5* 12.2*  HCT 35.7* 37.6*  PLT 173 261   BMET:  Recent Labs    03/18/24 0348 03/19/24 0436  NA 138 135  K 4.2 3.9  CL 100 97*  CO2 29 28  GLUCOSE 117* 128*  BUN 14 15  CREATININE 0.75 0.80  CALCIUM  8.9 9.1    PT/INR:  Recent Labs    03/20/24 0516  LABPROT 25.1*  INR 2.2*   ABG    Component Value Date/Time   PHART 7.369 03/16/2024 0550   HCO3 22.3 03/16/2024 0550   TCO2 23 03/16/2024 0550   ACIDBASEDEF 2.0 03/16/2024 0550   O2SAT 91 03/16/2024 0550   CBG (last 3)  Recent Labs    03/18/24 0626 03/18/24 1237 03/18/24 2150  GLUCAP 113* 121* 135*    Meds Scheduled Meds:  acetaminophen   1,000 mg Oral Q6H   Or   acetaminophen  (TYLENOL ) oral liquid 160 mg/5 mL  1,000 mg Per Tube Q6H   aspirin  EC  81 mg Oral Daily   Or   aspirin   81 mg Per Tube Daily   bisacodyl   10 mg Oral Daily   Chlorhexidine  Gluconate Cloth  6 each Topical Q0600   docusate sodium   200 mg Oral  Daily   enoxaparin  (LOVENOX ) injection  40 mg Subcutaneous QHS   guaiFENesin   600 mg Oral BID   metoprolol  tartrate  50 mg Oral BID   pantoprazole   40 mg Oral Daily   potassium chloride   40 mEq Oral Daily   torsemide   20 mg Oral Daily   valACYclovir   1,000 mg Oral BID   Warfarin - Physician Dosing Inpatient   Does not apply q1600   Continuous Infusions: PRN Meds:.ALPRAZolam , bisacodyl , lactulose , metoprolol  tartrate, ondansetron  (ZOFRAN ) IV, mouth rinse, oxyCODONE , traMADol   Xrays No results found.  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

## 2024-03-20 NOTE — Plan of Care (Signed)
 " Problem: Education: Goal: Knowledge of General Education information will improve Description: Including pain rating scale, medication(s)/side effects and non-pharmacologic comfort measures 03/20/2024 0837 by Gary Michaelis, RN Outcome: Progressing 03/19/2024 1945 by Gary Michaelis, RN Outcome: Progressing   Problem: Health Behavior/Discharge Planning: Goal: Ability to manage health-related needs will improve 03/20/2024 0837 by Gary Michaelis, RN Outcome: Progressing 03/19/2024 1945 by Gary Michaelis, RN Outcome: Progressing   Problem: Clinical Measurements: Goal: Ability to maintain clinical measurements within normal limits will improve 03/20/2024 0837 by Gary Michaelis, RN Outcome: Progressing 03/19/2024 1945 by Gary Michaelis, RN Outcome: Progressing Goal: Will remain free from infection 03/20/2024 0837 by Gary Michaelis, RN Outcome: Progressing 03/19/2024 1945 by Gary Michaelis, RN Outcome: Progressing Goal: Diagnostic test results will improve 03/20/2024 0837 by Gary Michaelis, RN Outcome: Progressing 03/19/2024 1945 by Gary Michaelis, RN Outcome: Progressing Goal: Respiratory complications will improve 03/20/2024 0837 by Gary Michaelis, RN Outcome: Progressing 03/19/2024 1945 by Gary Michaelis, RN Outcome: Progressing Goal: Cardiovascular complication will be avoided 03/20/2024 0837 by Gary Michaelis, RN Outcome: Progressing 03/19/2024 1945 by Gary Michaelis, RN Outcome: Progressing   Problem: Activity: Goal: Risk for activity intolerance will decrease 03/20/2024 0837 by Gary Michaelis, RN Outcome: Progressing 03/19/2024 1945 by Gary Michaelis, RN Outcome: Progressing   Problem: Nutrition: Goal: Adequate nutrition will be maintained 03/20/2024 0837 by Gary Michaelis, RN Outcome: Progressing 03/19/2024 1945 by Gary Michaelis, RN Outcome: Progressing   Problem: Coping: Goal: Level of anxiety will  decrease 03/20/2024 0837 by Gary Michaelis, RN Outcome: Progressing 03/19/2024 1945 by Gary Michaelis, RN Outcome: Progressing   Problem: Elimination: Goal: Will not experience complications related to bowel motility 03/20/2024 0837 by Gary Michaelis, RN Outcome: Progressing 03/19/2024 1945 by Gary Michaelis, RN Outcome: Progressing Goal: Will not experience complications related to urinary retention 03/20/2024 0837 by Gary Michaelis, RN Outcome: Progressing 03/19/2024 1945 by Gary Michaelis, RN Outcome: Progressing   Problem: Pain Managment: Goal: General experience of comfort will improve and/or be controlled 03/20/2024 0837 by Gary Michaelis, RN Outcome: Progressing 03/19/2024 1945 by Gary Michaelis, RN Outcome: Progressing   Problem: Safety: Goal: Ability to remain free from injury will improve 03/20/2024 0837 by Gary Michaelis, RN Outcome: Progressing 03/19/2024 1945 by Gary Michaelis, RN Outcome: Progressing   Problem: Skin Integrity: Goal: Risk for impaired skin integrity will decrease 03/20/2024 0837 by Gary Michaelis, RN Outcome: Progressing 03/19/2024 1945 by Gary Michaelis, RN Outcome: Progressing   Problem: Education: Goal: Will demonstrate proper wound care and an understanding of methods to prevent future damage 03/20/2024 0837 by Gary Michaelis, RN Outcome: Progressing 03/19/2024 1945 by Gary Michaelis, RN Outcome: Progressing Goal: Knowledge of disease or condition will improve 03/20/2024 0837 by Gary Michaelis, RN Outcome: Progressing 03/19/2024 1945 by Gary Michaelis, RN Outcome: Progressing Goal: Knowledge of the prescribed therapeutic regimen will improve 03/20/2024 0837 by Gary Michaelis, RN Outcome: Progressing 03/19/2024 1945 by Gary Michaelis, RN Outcome: Progressing Goal: Individualized Educational Video(s) 03/20/2024 0837 by Gary Michaelis, RN Outcome: Progressing 03/19/2024  1945 by Gary Michaelis, RN Outcome: Progressing   Problem: Activity: Goal: Risk for activity intolerance will decrease 03/20/2024 0837 by Gary Michaelis, RN Outcome: Progressing 03/19/2024 1945 by Gary Michaelis, RN Outcome: Progressing   Problem: Cardiac: Goal: Will achieve and/or maintain hemodynamic stability 03/20/2024 0837 by Gary Michaelis, RN Outcome: Progressing 03/19/2024 1945 by Gary Michaelis, RN Outcome: Progressing   Problem: Clinical Measurements: Goal: Postoperative complications will be avoided or minimized 03/20/2024 0837 by Gary Michaelis, RN Outcome: Progressing 03/19/2024 1945 by Gary Michaelis, RN Outcome: Progressing  Problem: Respiratory: Goal: Respiratory status will improve 03/20/2024 0837 by Gary Michaelis, RN Outcome: Progressing 03/19/2024 1945 by Gary Michaelis, RN Outcome: Progressing   Problem: Skin Integrity: Goal: Wound healing without signs and symptoms of infection 03/20/2024 0837 by Gary Michaelis, RN Outcome: Progressing 03/19/2024 1945 by Gary Michaelis, RN Outcome: Progressing Goal: Risk for impaired skin integrity will decrease 03/20/2024 0837 by Gary Michaelis, RN Outcome: Progressing 03/19/2024 1945 by Gary Michaelis, RN Outcome: Progressing   Problem: Urinary Elimination: Goal: Ability to achieve and maintain adequate renal perfusion and functioning will improve 03/20/2024 0837 by Gary Michaelis, RN Outcome: Progressing 03/19/2024 1945 by Gary Michaelis, RN Outcome: Progressing   "

## 2024-03-20 NOTE — Plan of Care (Signed)
 " Problem: Education: Goal: Knowledge of General Education information will improve Description: Including pain rating scale, medication(s)/side effects and non-pharmacologic comfort measures 03/20/2024 0838 by Gary Michaelis, RN Outcome: Adequate for Discharge 03/20/2024 0837 by Gary Michaelis, RN Outcome: Progressing 03/19/2024 1945 by Gary Michaelis, RN Outcome: Progressing   Problem: Health Behavior/Discharge Planning: Goal: Ability to manage health-related needs will improve 03/20/2024 0838 by Gary Michaelis, RN Outcome: Adequate for Discharge 03/20/2024 0837 by Gary Michaelis, RN Outcome: Progressing 03/19/2024 1945 by Gary Michaelis, RN Outcome: Progressing   Problem: Clinical Measurements: Goal: Ability to maintain clinical measurements within normal limits will improve 03/20/2024 0838 by Gary Michaelis, RN Outcome: Adequate for Discharge 03/20/2024 0837 by Gary Michaelis, RN Outcome: Progressing 03/19/2024 1945 by Gary Michaelis, RN Outcome: Progressing Goal: Will remain free from infection 03/20/2024 0838 by Gary Michaelis, RN Outcome: Adequate for Discharge 03/20/2024 0837 by Gary Michaelis, RN Outcome: Progressing 03/19/2024 1945 by Gary Michaelis, RN Outcome: Progressing Goal: Diagnostic test results will improve 03/20/2024 0838 by Gary Michaelis, RN Outcome: Adequate for Discharge 03/20/2024 0837 by Gary Michaelis, RN Outcome: Progressing 03/19/2024 1945 by Gary Michaelis, RN Outcome: Progressing Goal: Respiratory complications will improve 03/20/2024 0838 by Gary Michaelis, RN Outcome: Adequate for Discharge 03/20/2024 0837 by Gary Michaelis, RN Outcome: Progressing 03/19/2024 1945 by Gary Michaelis, RN Outcome: Progressing Goal: Cardiovascular complication will be avoided 03/20/2024 0838 by Gary Michaelis, RN Outcome: Adequate for Discharge 03/20/2024 0837 by Gary Michaelis, RN Outcome:  Progressing 03/19/2024 1945 by Gary Michaelis, RN Outcome: Progressing   Problem: Activity: Goal: Risk for activity intolerance will decrease 03/20/2024 0838 by Gary Michaelis, RN Outcome: Adequate for Discharge 03/20/2024 0837 by Gary Michaelis, RN Outcome: Progressing 03/19/2024 1945 by Gary Michaelis, RN Outcome: Progressing   Problem: Nutrition: Goal: Adequate nutrition will be maintained 03/20/2024 0838 by Gary Michaelis, RN Outcome: Adequate for Discharge 03/20/2024 0837 by Gary Michaelis, RN Outcome: Progressing 03/19/2024 1945 by Gary Michaelis, RN Outcome: Progressing   Problem: Coping: Goal: Level of anxiety will decrease 03/20/2024 0838 by Gary Michaelis, RN Outcome: Adequate for Discharge 03/20/2024 0837 by Gary Michaelis, RN Outcome: Progressing 03/19/2024 1945 by Gary Michaelis, RN Outcome: Progressing   Problem: Elimination: Goal: Will not experience complications related to bowel motility 03/20/2024 0838 by Gary Michaelis, RN Outcome: Adequate for Discharge 03/20/2024 0837 by Gary Michaelis, RN Outcome: Progressing 03/19/2024 1945 by Gary Michaelis, RN Outcome: Progressing Goal: Will not experience complications related to urinary retention 03/20/2024 0838 by Gary Michaelis, RN Outcome: Adequate for Discharge 03/20/2024 0837 by Gary Michaelis, RN Outcome: Progressing 03/19/2024 1945 by Gary Michaelis, RN Outcome: Progressing   Problem: Pain Managment: Goal: General experience of comfort will improve and/or be controlled 03/20/2024 0838 by Gary Michaelis, RN Outcome: Adequate for Discharge 03/20/2024 0837 by Gary Michaelis, RN Outcome: Progressing 03/19/2024 1945 by Gary Michaelis, RN Outcome: Progressing   Problem: Safety: Goal: Ability to remain free from injury will improve 03/20/2024 0838 by Gary Michaelis, RN Outcome: Adequate for Discharge 03/20/2024 0837 by Gary Michaelis,  RN Outcome: Progressing 03/19/2024 1945 by Gary Michaelis, RN Outcome: Progressing   Problem: Skin Integrity: Goal: Risk for impaired skin integrity will decrease 03/20/2024 0838 by Gary Michaelis, RN Outcome: Adequate for Discharge 03/20/2024 0837 by Gary Michaelis, RN Outcome: Progressing 03/19/2024 1945 by Gary Michaelis, RN Outcome: Progressing   Problem: Education: Goal: Will demonstrate proper wound care and an understanding of methods to prevent future damage 03/20/2024 0838 by Gary Michaelis, RN Outcome: Adequate for Discharge 03/20/2024 0837 by Gary Michaelis, RN Outcome: Progressing 03/19/2024 1945  by Gary Michaelis, RN Outcome: Progressing Goal: Knowledge of disease or condition will improve 03/20/2024 0838 by Gary Michaelis, RN Outcome: Adequate for Discharge 03/20/2024 0837 by Gary Michaelis, RN Outcome: Progressing 03/19/2024 1945 by Gary Michaelis, RN Outcome: Progressing Goal: Knowledge of the prescribed therapeutic regimen will improve 03/20/2024 0838 by Gary Michaelis, RN Outcome: Adequate for Discharge 03/20/2024 0837 by Gary Michaelis, RN Outcome: Progressing 03/19/2024 1945 by Gary Michaelis, RN Outcome: Progressing Goal: Individualized Educational Video(s) 03/20/2024 0838 by Gary Michaelis, RN Outcome: Adequate for Discharge 03/20/2024 0837 by Gary Michaelis, RN Outcome: Progressing 03/19/2024 1945 by Gary Michaelis, RN Outcome: Progressing   Problem: Activity: Goal: Risk for activity intolerance will decrease 03/20/2024 0838 by Gary Michaelis, RN Outcome: Adequate for Discharge 03/20/2024 0837 by Gary Michaelis, RN Outcome: Progressing 03/19/2024 1945 by Gary Michaelis, RN Outcome: Progressing   Problem: Cardiac: Goal: Will achieve and/or maintain hemodynamic stability 03/20/2024 0838 by Gary Michaelis, RN Outcome: Adequate for Discharge 03/20/2024 0837 by Gary Michaelis,  RN Outcome: Progressing 03/19/2024 1945 by Gary Michaelis, RN Outcome: Progressing   Problem: Clinical Measurements: Goal: Postoperative complications will be avoided or minimized 03/20/2024 0838 by Gary Michaelis, RN Outcome: Adequate for Discharge 03/20/2024 0837 by Gary Michaelis, RN Outcome: Progressing 03/19/2024 1945 by Gary Michaelis, RN Outcome: Progressing   Problem: Respiratory: Goal: Respiratory status will improve 03/20/2024 0838 by Gary Michaelis, RN Outcome: Adequate for Discharge 03/20/2024 0837 by Gary Michaelis, RN Outcome: Progressing 03/19/2024 1945 by Gary Michaelis, RN Outcome: Progressing   Problem: Skin Integrity: Goal: Wound healing without signs and symptoms of infection 03/20/2024 0838 by Gary Michaelis, RN Outcome: Adequate for Discharge 03/20/2024 0837 by Gary Michaelis, RN Outcome: Progressing 03/19/2024 1945 by Gary Michaelis, RN Outcome: Progressing Goal: Risk for impaired skin integrity will decrease 03/20/2024 0838 by Gary Michaelis, RN Outcome: Adequate for Discharge 03/20/2024 0837 by Gary Michaelis, RN Outcome: Progressing 03/19/2024 1945 by Gary Michaelis, RN Outcome: Progressing   Problem: Urinary Elimination: Goal: Ability to achieve and maintain adequate renal perfusion and functioning will improve 03/20/2024 0838 by Gary Michaelis, RN Outcome: Adequate for Discharge 03/20/2024 0837 by Gary Michaelis, RN Outcome: Progressing 03/19/2024 1945 by Gary Michaelis, RN Outcome: Progressing   "

## 2024-03-23 ENCOUNTER — Telehealth (HOSPITAL_COMMUNITY): Payer: Self-pay

## 2024-03-23 NOTE — Telephone Encounter (Signed)
 Attempted to call patient in regards to Cardiac Rehab - LM on VM

## 2024-03-23 NOTE — Telephone Encounter (Signed)
 Referral recv'd and verified for MD signature. Follow up appointment is on 1/12. Insurance benefits and eligibility TBD.

## 2024-03-24 ENCOUNTER — Ambulatory Visit: Admitting: *Deleted

## 2024-03-24 DIAGNOSIS — Z954 Presence of other heart-valve replacement: Secondary | ICD-10-CM

## 2024-03-24 DIAGNOSIS — Z5181 Encounter for therapeutic drug level monitoring: Secondary | ICD-10-CM | POA: Diagnosis not present

## 2024-03-24 LAB — POCT INR: POC INR: 2.4

## 2024-03-24 NOTE — Progress Notes (Addendum)
 Lab Results  Component Value Date   INR 2.4 03/24/2024   INR 2.2 (H) 03/20/2024   INR 1.7 (H) 03/19/2024   Description   INR 2.4; Continue taking warfarin 1 tablet daily. Recheck INR in 1 week. Please call for any medication changes or upcoming procedures. Coumadin  Clinic [phone] A full discussion of the nature of anticoagulants has been carried out.  A benefit risk analysis has been presented to the patient, so that they understand the justification for choosing anticoagulation at this time. The need for frequent and regular monitoring, precise dosage adjustment and compliance is stressed.  Side effects of potential bleeding are discussed.  The patient should avoid any OTC items containing aspirin  or ibuprofen, and should avoid great swings in general diet.  Avoid alcohol consumption.  Call if any signs of abnormal bleeding.

## 2024-03-24 NOTE — Patient Instructions (Addendum)
 Description   INR 2.4; Continue taking warfarin 1 tablet daily. Recheck INR in 1 week. Please call for any medication changes or upcoming procedures. Coumadin  Clinic [phone] A full discussion of the nature of anticoagulants has been carried out.  A benefit risk analysis has been presented to the patient, so that they understand the justification for choosing anticoagulation at this time. The need for frequent and regular monitoring, precise dosage adjustment and compliance is stressed.  Side effects of potential bleeding are discussed.  The patient should avoid any OTC items containing aspirin  or ibuprofen, and should avoid great swings in general diet.  Avoid alcohol consumption.  Call if any signs of abnormal bleeding.

## 2024-03-26 ENCOUNTER — Ambulatory Visit
Attending: Thoracic Surgery (Cardiothoracic Vascular Surgery) | Admitting: Thoracic Surgery (Cardiothoracic Vascular Surgery)

## 2024-03-26 DIAGNOSIS — Z954 Presence of other heart-valve replacement: Secondary | ICD-10-CM

## 2024-03-26 NOTE — Progress Notes (Signed)
" °   °  [id number] [place] [address]             [phone]      Patient: Home Provider: Office Consent for Telemedicine visit obtained.  Todays visit was completed via a real-time telehealth (see specific modality noted below). The patient/authorized person provided oral consent at the time of the visit to engage in a telemedicine encounter with the present provider at [facility]. The patient/authorized person was informed of the potential benefits, limitations, and risks of telemedicine. The patient/authorized person expressed understanding that the laws that protect confidentiality also apply to telemedicine. The patient/authorized person acknowledged understanding that telemedicine does not provide emergency services and that he or she would need to call 911 or proceed to the nearest hospital for help if such a need arose.   Total time spent in the clinical discussion 10 minutes.  Telehealth Modality: Phone visit (audio only)  I had a telephone visit with  Dustin [name] who is s/p AVR.  Overall doing well.  Pain is minimal.  Ambulating well. Vitals have been stable.  Dustin [name] will see us  back in 1 month with a chest x-ray for cardiac rehab clearance.  Dustin [name] O Dustin [name]  "

## 2024-03-27 NOTE — Progress Notes (Deleted)
 "  Cardiology Office Note:    Date:  03/27/2024   ID:  Dustin [name], DOB [birth year], MRN [id number]  PCP:  Sim Emery CROME, MD  Cardiologist:  Newman JINNY Lawrence, MD { Click to update primary MD,subspecialty MD or APP then REFRESH:1}    Referring MD: Sim Emery CROME, MD   Chief Complaint: hospital follow-up of recent AVR  History of Present Illness:    Dustin [name] is a 44 y.o. male with a history of normal coronaries on cardiac catheterization in 02/2024, bicuspid aortic valve with severe aortic insufficiency s/p mechanical AVR on 03/15/2024 on Coumadin , anomalous middle right pulmonary vein, hypertension, and hyperlipidemia who is followed by Dr. Lawrence and presents today for hospital follow-up of recent AVR.   Patient was referred to Dr. Lawrence in 01/2020 for further evaluation of dyspnea on exertion.  Coronary CTA and Echo were ordered for further evaluation. Coronary CTA showed a coronary calcium  score of 0 with no evidence of CAD as well as a bicuspid aortic valve with moderate to severe leaflet calcification, mild ascending aortic dilatation (measuring 41mm), and abnormal pulmonary vein drainage into the left atrium. Echo showed LVEF of 55% with grade 1 diastolic dysfunction, mild aortic stenosis/ moderate aortic insufficiency, and mild tricuspid regurgitation. Monitor in 02/2020 for further evaluation of palpitations showed one short 4 beat run of atrial tachycardia and rare PACs/ PVCs but no significant arrhythmias. Triggered events correlated with sinus rhythm. His aortic valve disease has been followed closely since then. 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 At most recent visit in 02/2024, he reported getting out of breath and  tired easily with minimal activity. TEE and R/ LHC were arranged and he was referred to CT surgery. TEE confirmed a bicuspid aortic valve with severe aortic insufficiency as well as a anomalous middle right pulmonary vein. 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 He was seen by CT Surgery and surgery was recommended. He presented to [facility] and underwent a mechanical AVR on 03/15/2024 with Dr. Shyrl. Post-op course was unremarkable. He was started on Coumadin  prior to discharge.   Patient presents today for follow-up. ***  Bicuspid Aortic Valve Severe Aortic Insufficiency s/p Mechanical AVR S/p mechanical AVR on 03/15/2024.  - Recovering well. *** - Continue Coumadin . INR being followed by our Coumadin  Clinic.  - Will need SBE prophylaxis prior to any dental work. Will prescribed prescription of Amoxicillin 2,000mg  for him to take 1 hour prior to any dental cleanings/ work. ***  Hypertension BP *** - Continue Lopressor  50mg  twice daily.   Hyperlipidemia Lipid panel *** - ***  EKGs/Labs/Other Studies Reviewed:    The following studies were reviewed:  TTE 01/30/2023: Impressions: 1. Left ventricular ejection fraction, by estimation, is 60 to 65%. The  left ventricle has normal function. The left ventricle has no regional  wall motion abnormalities. There is mild left ventricular hypertrophy.  Left ventricular diastolic parameters  are consistent with Grade I diastolic dysfunction (impaired relaxation).   2. Right ventricular systolic function is normal. The right ventricular  size is normal.   3. Left atrial size was mildly dilated.   4. The mitral valve is grossly normal. Trivial mitral valve  regurgitation.   5. The  aortic valve has an indeterminant number of cusps. There is  moderate calcification of the aortic valve. Aortic valve regurgitation is  severe. Mild to moderate aortic valve  stenosis. Aortic regurgitation PHT  measures 324 msec. Aortic valve area,  by VTI measures 1.96 cm. Aortic valve mean gradient measures 29.5 mmHg.  Aortic valve Vmax measures 3.65 m/s.   6. Aortic dilatation noted. 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   7. The inferior vena cava is normal in size with greater than 50%  respiratory variability, suggesting right atrial pressure of 3 mmHg.   Comparison(s): Changes from prior study are noted. 07/02/2022: LVEF 60-65%,  mild LVH, moderate AS, moderate to severe AI.  _______________  TEE 02/27/2024: Impressions: 1. Left ventricular ejection fraction, by estimation, is 60 to 65%. The  left ventricle has normal function.   2. Right ventricular systolic function is normal. The right ventricular  size is normal.   3. Left atrial size was moderately dilated. No left atrial/left atrial  appendage thrombus was detected. The LAA emptying velocity was 71 cm/s.   4. Right atrial size was moderately dilated.   5. The mitral valve is normal in structure. Mild mitral valve  regurgitation.   6. The aortic valve is bicuspid. Aortic valve regurgitation is severe.  Aortic valve sclerosis/calcification is present, without any evidence of  aortic stenosis.   7. 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   8. Aortic dilatation noted. There is mild dilatation of the ascending  aorta, measuring 41 mm.   9. Anomalous middle right pulmonary vein.  10. 3D performed of the aortic valve and demonstrates Bicuspid aortic  valve.  _______________  R/ LHC 02/27/2024: LM: Normal LAD: Normal, no significant disease. Ramus: Normal, no significant disease. Lcx:  Normal, no significant disease. RCA: Dominant.  Normal, no significant disease. LVEDP 25 mmHg No significant LV-Ao gradient   RA: 14 mmHg RV: 48/8 mmHg PA: 45/24 mmHg, mPAP 34 mmHg PCW: 27 mmHg   AO  sats: 97% PA sats: 69%   CO: 5.6 L/min CI: 2.3 L/min/m2   Conclusion: No coronary artery disease Elevated filling pressures No LV-Ao gradient Moderate pulmonary hypertension. WHO grp II   Recommendation: Recommend CVTS consultation for possible AVR +/- ascending aorta replacement  EKG:  EKG ordered today. EKG personally reviewed and demonstrates ***.  Recent Labs: 03/11/2024: ALT 15 03/16/2024: Magnesium  2.3 03/19/2024: BUN 15; Creatinine, Ser 0.80; Hemoglobin 12.2; Platelets 261; Potassium 3.9; Sodium 135  Recent Lipid Panel No results found for: CHOL, TRIG, HDL, CHOLHDL, VLDL, LDLCALC, LDLDIRECT  Physical Exam:    Vital Signs: There were no vitals taken for this visit.    Wt Readings from Last 3 Encounters:  03/20/24 248 lb 6.4 oz (112.7 kg)  03/11/24 261 lb (118.4 kg)  03/05/24 269 lb (122 kg)     General: 44 y.o. male in no acute distress. HEENT: Normocephalic and atraumatic. Sclera clear.  Neck: Supple. No carotid bruits. No JVD. Heart: *** RRR. Distinct S1 and S2. No murmurs, gallops, or rubs.  Lungs: No increased work of breathing. Clear to ausculation bilaterally. No wheezes, rhonchi, or rales.  Abdomen: Soft, non-distended, and non-tender to palpation.  Extremities: No lower extremity edema.  Radial and distal pedal pulses 2+ and equal bilaterally. Skin: Warm and dry. Neuro: No focal deficits. Psych: Normal affect. Responds appropriately.   Assessment:    No diagnosis found.  Plan:     Disposition: Follow up in ***   Signed, Reshonda Koerber E Sidney Silberman, PA-C  03/27/2024 1:04 PM    [facility] "

## 2024-04-01 ENCOUNTER — Ambulatory Visit: Attending: Cardiology

## 2024-04-01 DIAGNOSIS — Z5181 Encounter for therapeutic drug level monitoring: Secondary | ICD-10-CM

## 2024-04-01 DIAGNOSIS — Z954 Presence of other heart-valve replacement: Secondary | ICD-10-CM | POA: Diagnosis not present

## 2024-04-01 LAB — POCT INR: INR: 2.7 (ref 2.0–3.0)

## 2024-04-01 NOTE — Patient Instructions (Signed)
"   Continue taking warfarin 1 tablet daily. Recheck INR in 1 week. Please call for any medication changes or upcoming procedures. [facility]madin  Clinic [phone] "

## 2024-04-01 NOTE — Progress Notes (Signed)
"   INR 2.7 Please see anticoagulation encounter Continue taking warfarin 1 tablet daily. Recheck INR in 1 week. Please call for any medication changes or upcoming procedures. Coumadin  Clinic [phone] "

## 2024-04-04 NOTE — Progress Notes (Unsigned)
 " Cardiology Office Note   Date:  04/05/2024  ID:  Dustin [name], DOB 13-Feb-1981, MRN [id number] PCP: Sim Emery CROME, MD  [facility] Providers Cardiologist:  Newman JINNY Lawrence, MD     History of Present Illness Dustin [name] is a 44 y.o. male with history of bicuspid severe AI s/p AVR, hyperlipidemia, hypertension, TAA, and palpitations.    He initially establish care with Dr. Lawrence 01/2020 in the setting of dyspnea on exertion and hypertension. He has a strong family history with his father having a fatal MI at age 44.  Echo 01/2020 LVEF 55%, grade 1 DD, LA mildly dilated, mild aortic stenosis, moderate aortic regurgitation, mild tricuspid regurgitation, and aortic root mildly dilated at 3.8 cm.  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  Heart monitor 02/2020 results; predominantly NSR, 1 episode of atrial tachycardia at 141 bpm for 4 beats, rare isolated SVE and VE couplets, no atrial fibrillation/a flutter/VT/high-grade AV block noted. Echo 01/2023 showing severe AR and mild to moderate aortic stenosis. He was not symptomatic at this time.  02/23/2024 he noted the breath and fatigue with minimal physical activity.  He was referred to CVTS and workup started for AVR.  TEE 02/27/2024 results; LVEF 60 to 65%, RV normal, LA and RA moderately dilated, mild MR, severe AR, aortic valve sclerosis without aortic stenosis, mild dilation of ascending aorta measuring 41 mm, and anomalous middle right pulmonary vein.Underwent left and right cardiac catheterization 02/27/2024 results; no noted, elevated filling pressures, and moderate pulmonary hypertension.    He underwent mechanical AVR surgery 03/15/2024 without any postoperative  events.  Was discharged home 03/20/2024 on Coumadin .  He was seen by Dr. Shyrl 03/26/2024 via telehealth and appeared to be doing well s/p AVR.  Vital stable, minimal pain, and ambulating well.    He presents today for follow-up s/p mechanical AVR. He is walking 15 minuets daily. He tried walking 30 minuets and he noticed fatigue. He has not yet started cardiac rehab. He denies any CP, SOB, and palpitations with activity. He notes chest pain when laying down at night. He attributes this to his surgical site and says he thinks his heart is okay. I agree this sounds like chest wall pain from chest sternotomy. He denies chest pain, shortness of breath, lower extremity edema, fatigue, palpitations, melena, hematuria, hemoptysis, diaphoresis, weakness, presyncope, syncope, orthopnea, and PND.  ROS: All systems negative unless otherwise indicated HPI.  Studies Reviewed EKG Interpretation Date/Time:  Monday April 05 2024 10:54:07 EST Ventricular Rate:  75 PR Interval:  192 QRS Duration:  102 QT Interval:  376 QTC Calculation: 419 R Axis:   -18  Text Interpretation: Normal sinus rhythm Left ventricular hypertrophy with repolarization abnormality ( R in aVL , Cornell product ) When compared with ECG of 17-Mar-2024 17:11, T wave inversion no longer evident in Inferior leads T wave inversion less evident in Lateral leads Confirmed by Teresa Fish [phone]) on 04/05/2024 11:02:54 AM    Cardiac Studies & Procedures   ______________________________________________________________________________________________ CARDIAC CATHETERIZATION  CARDIAC CATHETERIZATION 02/27/2024  Conclusion Coronary angiography 02/27/2024: LM: Normal LAD: Normal, no significant disease. Ramus: Normal, no significant disease. Lcx:  Normal, no significant disease. RCA: Dominant.  Normal, no significant disease.  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 Exam Location:  [address]  Procedure: 2D Echo, Cardiac Doppler, Color Doppler, 3D Echo and Strain Analysis  Indications:    Q23.81 Bicuspid aortic valve with ascending aorta 4.0 to 4.5cm in diameter; I71.2 Ascending aortic aneurysm  History:        Patient has prior history of Echocardiogram examinations, most recent 07/02/2022. Signs/Symptoms:Fatigue and Chest Pain; Risk Factors:Family History of Coronary Artery Disease, Hypertension and Dyslipidemia. Anomalous pulmonary venous drainage.  Sonographer:    Jon Hacker RCS Referring Phys: [id number] [facility] J PATWARDHAN  IMPRESSIONS   1. Left ventricular ejection fraction, by estimation, is 60 to 65%. The left ventricle has normal function. The left ventricle has no regional wall motion abnormalities. There is mild left ventricular hypertrophy. Left ventricular diastolic parameters are consistent with Grade I diastolic dysfunction (impaired relaxation). 2. Right ventricular systolic function is normal. The right ventricular size is normal. 3. Left atrial size was mildly dilated. 4. The mitral valve is grossly normal. Trivial mitral valve regurgitation. 5.  The aortic valve has an indeterminant number of cusps. There is moderate calcification of the aortic valve. Aortic valve regurgitation is severe. Mild to moderate aortic valve stenosis. Aortic regurgitation PHT measures 324 msec. Aortic valve area, by VTI measures 1.96 cm. Aortic valve mean gradient measures 29.5 mmHg. Aortic valve Vmax measures 3.65 m/s. 6. Aortic dilatation noted. There is mild dilatation of the aortic root, measuring 40 mm. There is moderate dilatation of the ascending aorta, measuring 46 mm. 7. The inferior vena cava is normal in size with greater than 50% respiratory variability, suggesting right atrial pressure of 3 mmHg.  Comparison(s): Changes from prior study are noted. 07/02/2022: LVEF 60-65%, mild LVH, moderate AS, moderate to severe AI.  FINDINGS Left Ventricle: Left ventricular ejection fraction, by estimation, is 60 to 65%. The left ventricle has normal function. The left ventricle has no regional wall motion abnormalities. The left ventricular internal cavity size was normal in size. There is mild left ventricular hypertrophy. Left ventricular diastolic parameters are consistent with Grade I diastolic dysfunction (impaired relaxation). Indeterminate filling pressures.  Right Ventricle: The right ventricular size is normal. No increase in right ventricular wall thickness. Right ventricular systolic function is normal.  Left Atrium: Left atrial size was mildly dilated.  Right Atrium: Right atrial size was normal in size.  Pericardium: There is no evidence of pericardial effusion.  Mitral Valve: The mitral valve is grossly normal. Trivial mitral valve regurgitation.  Tricuspid Valve: The tricuspid valve is grossly normal. Tricuspid valve regurgitation is trivial.  Aortic Valve: The aortic valve has an indeterminant number of cusps. There is moderate calcification of the aortic valve. Aortic valve regurgitation is severe. Aortic regurgitation PHT measures 324 msec.  Mild to moderate aortic stenosis is present. Aortic valve mean gradient measures 29.5 mmHg. Aortic valve peak gradient measures 53.3 mmHg. Aortic valve area, by VTI measures 1.96 cm.  Pulmonic Valve: The pulmonic valve was not well visualized. Pulmonic valve regurgitation is not visualized.  Aorta: Aortic dilatation noted. There is mild dilatation of the aortic root, measuring 40 mm. There is moderate dilatation of the ascending aorta, measuring 46 mm.  Venous: The inferior vena cava is normal in size with greater than 50% respiratory variability, suggesting right atrial pressure of 3 mmHg.  IAS/Shunts: No atrial level shunt detected by color flow Doppler.   LEFT VENTRICLE PLAX 2D LVIDd:         5.20 cm   Diastology LVIDs:         2.90 cm   LV e' medial:    6.96 cm/s LV PW:         1.30 cm   LV E/e' medial:  10.2 LV IVS:        1.10 cm   LV e' lateral:   8.16 cm/s LVOT diam:     2.60 cm   LV E/e' lateral: 8.7 LV SV:         183 LV SV Index:   76 LVOT Area:     5.31 cm  3D Volume EF: 3D EF:        59 % LV EDV:       342 ml LV ESV:       141 ml LV SV:        201 ml  RIGHT VENTRICLE RV Basal diam:  3.40 cm RV S prime:     14.90 cm/s TAPSE (M-mode): 2.8 cm  LEFT ATRIUM             Index        RIGHT ATRIUM           Index LA diam:        4.30 cm 1.78 cm/m   RA Area:     17.70 cm LA Vol (A2C):   58.3 ml 24.16 ml/m  RA Volume:   46.90 ml  19.43 ml/m LA Vol (A4C):   85.6 ml 35.47 ml/m LA Biplane Vol: 70.7 ml 29.30 ml/m AORTIC VALVE AV Area (Vmax):    1.93 cm AV Area (Vmean):   1.91 cm AV Area (VTI):     1.96 cm AV Vmax:           365.00 cm/s AV Vmean:          248.000 cm/s AV VTI:            0.934 m AV Peak Grad:      53.3 mmHg AV Mean Grad:      29.5 mmHg LVOT Vmax:         133.00 cm/s LVOT Vmean:        89.100 cm/s LVOT VTI:          0.344 m LVOT/AV VTI ratio: 0.37 AI PHT:            324 msec  AORTA Ao  Root diam: 4.00 cm Ao Asc diam:  4.60 cm  MITRAL  VALVE MV Area (PHT): 2.41 cm    SHUNTS MV Decel Time: 315 msec    Systemic VTI:  0.34 m MV E velocity: 71.00 cm/s  Systemic Diam: 2.60 cm MV A velocity: 66.50 cm/s MV E/A ratio:  1.07  Vinie Maxcy MD Electronically signed by Vinie Maxcy MD Signature Date/Time: 01/30/2023/12:57:31 PM    Final   TEE  ECHO INTRAOPERATIVE TEE 03/15/2024  Narrative *INTRAOPERATIVE TRANSESOPHAGEAL REPORT *    Patient Name:   Dustin [name]  Date of Exam: 03/15/2024 Medical Rec #:  [id number]      Height:       72.0 in Accession #:    [id number]     Weight:       262.0 lb Date of Birth:  10-19-1980      BSA:          2.39 m Patient Age:    43 years       BP:           148/67 mmHg Patient Gender: M              HR:           69 bpm. Exam Location:  Anesthesiology  Transesophogeal exam was perform intraoperatively during surgical procedure. Patient was closely monitored under general anesthesia during the entirety of examination.  Indications:     Aortic Stenosis i35.0 Sonographer:     Damien Senior RDCS Performing Phys: [id number] LINNIE KIDD LIGHTFOOT Diagnosing Phys: Elsie Needle MD  Complications: No known complications during this procedure. POST-OP IMPRESSIONS _ Left Ventricle: The left ventricle is unchanged from pre-bypass. _ Right Ventricle: The right ventricle appears unchanged from pre-bypass. _ Aorta: The aorta appears unchanged from pre-bypass. _ Aortic Valve: A mechanical valve was placed, leaflets are freely mobile Size; 25mm. Normal washing jets for valve type. The gradient recorded across the prosthetic valve is within the expected range. _ Mitral Valve: The mitral valve appears unchanged from pre-bypass. _ Tricuspid Valve: The tricuspid valve appears unchanged from pre-bypass.  PRE-OP FINDINGS Left Ventricle: The left ventricle has normal systolic function, with an ejection fraction of 55-60%. The cavity size was normal. There is no increase in left ventricular wall  thickness. No evidence of left ventricular regional wall motion abnormalities. There is no left ventricular hypertrophy.   Right Ventricle: The right ventricle has normal systolic function. The cavity was normal. There is no increase in right ventricular wall thickness.  Left Atrium: Left atrial size was not assessed. No left atrial/left atrial appendage thrombus was detected.  Right Atrium: Right atrial size was not assessed.  Interatrial Septum: No atrial level shunt detected by color flow Doppler.  Pericardium: There is no evidence of pericardial effusion.  Mitral Valve: The mitral valve is normal in structure. Mitral valve regurgitation is mild by color flow Doppler. There is moderate thickening present on the mitral valve anterior cusp with normal mobility.  Tricuspid Valve: The tricuspid valve was normal in structure. Tricuspid valve regurgitation was not visualized by color flow Doppler.  Aortic Valve: The aortic valve is bicuspid Aortic valve regurgitation is severe by color flow Doppler. There is moderate thickening and severe calcifcation present on the aortic valve where the right and left coronary cusps are fused, with severely decreased mobility.  Pulmonic Valve: The pulmonic valve was normal in structure. Pulmonic valve regurgitation is trivial by color flow Doppler.   Aorta: There is mild dilatation  of the ascending aorta, measuring 41 mm.  +--------------+--------++ LEFT VENTRICLE         +--------------+--------++ PLAX 2D                +--------------+--------++ LVOT diam:    3.10 cm  +--------------+--------++ LVOT Area:    7.55 cm +--------------+--------++                        +--------------+--------++  +------------------+------------++ AORTIC VALVE                   +------------------+------------++ AV Area (Vmax):   3.08 cm     +------------------+------------++ AV Area (Vmean):  2.80 cm      +------------------+------------++ AV Area (VTI):    3.24 cm     +------------------+------------++ AV Vmax:          229.50 cm/s  +------------------+------------++ AV Vmean:         189.500 cm/s +------------------+------------++ AV VTI:           0.576 m      +------------------+------------++ AV Peak Grad:     21.1 mmHg    +------------------+------------++ AV Mean Grad:     16.0 mmHg    +------------------+------------++ LVOT Vmax:        93.80 cm/s   +------------------+------------++ LVOT Vmean:       70.300 cm/s  +------------------+------------++ LVOT VTI:         0.247 m      +------------------+------------++ LVOT/AV VTI ratio:0.43         +------------------+------------++ AR PHT:           366 msec     +------------------+------------++  +------------+-------++ AORTA               +------------+-------++ Ao Asc diam:4.10 cm +------------+-------++   +--------------+-------+ SHUNTS                +--------------+-------+ Systemic VTI: 0.25 m  +--------------+-------+ Systemic Diam:3.10 cm +--------------+-------+   Elsie Needle MD Electronically signed by Elsie Needle MD Signature Date/Time: 03/15/2024/6:27:05 PM    Final  MONITORS  LONG TERM MONITOR (3-14 DAYS) 03/01/2020  Narrative Mobile cardiac telemetry 13 days 03/01/2020 - 03/14/2020: Dominant rhythm: Sinus. HR 47-144 bpm. Avg HR 80 bpm. 1 episodes of atrial tachycardia at 141 bpm for 4 beats. Rare isolated SVE, couplets. Rare isolated VE, couplets No atrial fibrillation/atrial flutter/VT/high grade AV block, sinus pause >3sec noted. 18 patient triggered events correlated with sinus rhythm.   CT SCANS  CT CORONARY MORPH W/CTA COR W/SCORE 02/22/2020  Addendum 02/22/2020  3:52 PM ADDENDUM REPORT: 02/22/2020 15:50  CLINICAL DATA:  44 Year-old Lebanese Male  Demographic assessment for MESA uses Asian  subset  EXAM: Cardiac/Coronary  CTA  TECHNIQUE: The patient was scanned on a [employer].  FINDINGS: A 100 kV prospective scan was triggered in the descending thoracic aorta at 111 HU's. Axial non-contrast 3 mm slices were carried out through the heart. The data set was analyzed on a dedicated work station and scored using the Agatson method. Gantry rotation speed was 250 msecs and collimation was .6 mm. No beta blockade and 0.8 mg of sl NTG was given. The 3D data set was reconstructed in 5% intervals of the 67-82 % of the R-R cycle. Diastolic phases were analyzed on a dedicated work station using MPR, MIP and VRT modes. The patient received 80 cc of contrast.  Aorta: Mild ascending aortic dilation, maximal diameter 41 mm. No calcifications. No  dissection.  Aortic Valve: Morphology consistent with bicuspid aortic valve. Moderate to severe leaflet calcification that extends into the left ventricular outflow tract. Aortic valve calcium  score 1448.  Coronary Arteries:  Normal coronary origin.  Right dominance.  Coronary calcium  score of 0. This was 1st percentile for age, sex, and race matched control.  RCA is a large dominant artery that gives rise to PDA and PLA. There is no plaque.  Left main is a large artery that gives rise to LAD and LCX arteries.  LAD is a large vessel that gives rise to two diagonal branches. There is no plaque.  LCX is a non-dominant artery that gives rise to one large OM1 branch. There is no plaque.  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  Normal left atrial appendage without a thrombus.  Normal size of the pulmonary artery.  Extra-cardiac findings: See attached radiology report for non-cardiac structures.  IMPRESSION: 1. Coronary calcium  score of 0. This was 1st percentile for age, sex, and race  matched control.  2. Normal coronary origin.  Right dominance.  3. CAD-RADS 0. No evidence of CAD (0%). Consider non-atherosclerotic causes of chest pain.  4. Bicuspid aortic valve with moderate to severe leaflet calcification that extends into the left ventricular outflow tract. Aortic valve calcium  score 1448.  5. Mild ascending aortic dilation, maximal diameter 41 mm.  6. 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  Will reach out to primary cardiologist regarding findings.   Electronically Signed By: Stanly Leavens MD On: 02/22/2020 15:50  Narrative EXAM: OVER-READ INTERPRETATION  CT CHEST  The following report is an over-read performed by radiologist Dr. Rockey Kilts of [facility] on 02/22/2020. This over-read does not include interpretation of cardiac or coronary anatomy or pathology. The coronary CTA interpretation by the cardiologist is attached.  COMPARISON:  01/13/2020 chest radiograph  FINDINGS: Vascular: Normal aortic caliber. No central pulmonary embolism, on this non-dedicated study.  Mediastinum/Nodes: No imaged thoracic adenopathy.  Lungs/Pleura: No pleural fluid.  Clear imaged lungs.  Upper Abdomen: Normal imaged portions of the liver, spleen.  Musculoskeletal: No acute osseous abnormality.  IMPRESSION: No acute findings in the imaged extracardiac chest.  Electronically Signed: By: Rockey Kilts M.D. On: 02/22/2020 14:19     ______________________________________________________________________________________________      Risk Assessment/Calculations           Physical Exam VS:  BP 114/80   Pulse 78   Ht 6' (1.829 m)   Wt 248 lb (112.5 kg)   SpO2 97%   BMI 33.63 kg/m        Wt Readings from Last 3 Encounters:  04/05/24 248 lb (112.5 kg)  03/20/24 248 lb 6.4 oz (112.7 kg)  03/11/24 261 lb (118.4 kg)    GEN: Well nourished, well developed in no acute distress NECK: No JVD; No carotid  bruits CARDIAC: RRR, no murmurs, rubs, gallops. Mechanical click noted.  RESPIRATORY:  Clear to auscultation without rales, wheezing or rhonchi  ABDOMEN: Soft, non-tender, non-distended EXTREMITIES:  No edema; No deformity   ASSESSMENT AND PLAN  Severe AI s/p AVR- TEE 02/27/2024 results; LVEF 60 to 65%, RV normal, LA and RA moderately dilated, mild MR, severe AR, aortic valve sclerosis without aortic stenosis, mild dilation of ascending aorta measuring 41 mm, and anomalous middle right pulmonary vein. Underwent mechanical AVR surgery 03/15/2024 without any postoperative events.  Was discharged home 03/20/2024 on Coumadin .Today he appears to be doing well. He walks 15 min daily. No CP, SOB, or palpations noted.   Repeat echo scheduled 04/27/24.  He requested FMLA and Tramadol . Follow up with CVTS.  SPE antibiotic amoxicillin  2g 30-60 min prior to any dental procedure or routine dental cleaning.   TAA- Seen on TEE 02/27/24 as above. Repeat echo yearly for surveillance.   Hypertension- BP today 114/80. Continue metoprolol  tartrate 50 mg BID.   Dyslipidemia- Last LDL 141 in 2023. Calculated 10 year ASCVD risk and was low risk, therefore lifestyle modification is appropriate. He says he get lab work with his PCP. He is agreeable to recheck his LDL when he is fasting. He does not want to start medication at this time. He will continue lifestyle modification.     Cardiac Rehabilitation Eligibility Assessment  The patient is ready to start cardiac rehabilitation pending clearance from the cardiac surgeon.       Dispo: Follow up with APP after echo in 3-4 months.   Signed, Mardy KATHEE Pizza, FNP  "

## 2024-04-05 ENCOUNTER — Ambulatory Visit: Attending: Student

## 2024-04-05 ENCOUNTER — Encounter: Payer: Self-pay | Admitting: Student

## 2024-04-05 ENCOUNTER — Encounter: Payer: Self-pay | Admitting: *Deleted

## 2024-04-05 VITALS — BP 114/80 | HR 78 | Ht 72.0 in | Wt 248.0 lb

## 2024-04-05 DIAGNOSIS — I77810 Thoracic aortic ectasia: Secondary | ICD-10-CM | POA: Diagnosis not present

## 2024-04-05 DIAGNOSIS — I10 Essential (primary) hypertension: Secondary | ICD-10-CM

## 2024-04-05 DIAGNOSIS — Z954 Presence of other heart-valve replacement: Secondary | ICD-10-CM | POA: Diagnosis not present

## 2024-04-05 DIAGNOSIS — E782 Mixed hyperlipidemia: Secondary | ICD-10-CM

## 2024-04-05 MED ORDER — AMOXICILLIN 500 MG PO TABS: ORAL_TABLET | ORAL | 2 refills | Status: AC

## 2024-04-05 NOTE — Patient Instructions (Addendum)
 Medication Instructions:  No medication changes were made during today's visit.  *If you need a refill on your cardiac medications before your next appointment, please call your pharmacy*   Lab Work: Return for fasting labs...............LIPID If you have labs (blood work) drawn today and your tests are completely normal, you will receive your results only by: MyChart Message (if you have MyChart) OR A paper copy in the mail If you have any lab test that is abnormal or we need to change your treatment, we will call you to review the results.   Testing/Procedures: No procedures were ordered during today's visit.   Your next appointment:   3-4 month(s)   Provider:   One of our Advanced Practice Providers (APPs): Morse Clause, PA-C  Lamarr Satterfield, NP Miriam Shams, NP  Olivia Pavy, PA-C Josefa Beauvais, NP  Leontine Salen, PA-C Orren Fabry, PA-C  Hao Meng, PA-C Ernest Dick, NP  Damien Braver, NP Jon Hails, PA-C  Waddell Donath, PA-C    Dayna Dunn, PA-C  Scott Weaver, PA-C Lum Louis, NP Katlyn West, NP Callie Goodrich, PA-C  Xika Zhao, NP Thom Sluder, PA-C    Kathleen Johnson, PA-C        Follow-Up: At [facility], you and your health needs are our priority.  As part of our continuing mission to provide you with exceptional heart care, we have created designated Provider Care Teams.  These Care Teams include your primary Cardiologist (physician) and Advanced Practice Providers (APPs -  Physician Assistants and Nurse Practitioners) who all work together to provide you with the care you need, when you need it. We recommend signing up for the patient portal called MyChart.  Sign up information is provided on this After Visit Summary.  MyChart is used to connect with patients for Virtual Visits (Telemedicine).  Patients are able to view lab/test results, encounter notes, upcoming appointments, etc.  Non-urgent messages can be sent to your provider as well.   To  learn more about what you can do with MyChart, go to [email or web address].

## 2024-04-08 ENCOUNTER — Ambulatory Visit: Attending: Cardiology | Admitting: *Deleted

## 2024-04-08 DIAGNOSIS — Z5181 Encounter for therapeutic drug level monitoring: Secondary | ICD-10-CM | POA: Diagnosis not present

## 2024-04-08 DIAGNOSIS — Z954 Presence of other heart-valve replacement: Secondary | ICD-10-CM

## 2024-04-08 LAB — POCT INR: POC INR: 3.6

## 2024-04-08 NOTE — Patient Instructions (Signed)
 Description   Inr 3.6, Hold warfarin today, then START taking warfarin 1 tablet daily except for 1/2  a tablet on Wednesdays. Recheck INR in 1 week. Please call for any medication changes or upcoming procedures. Coumadin  Clinic [phone]

## 2024-04-08 NOTE — Progress Notes (Signed)
 Lab Results  Component Value Date   INR 3.6 04/08/2024   INR 2.7 04/01/2024   INR 2.4 03/24/2024    Description   Inr 3.6, Hold warfarin today, then START taking warfarin 1 tablet daily except for 1/2  a tablet on Wednesdays. Recheck INR in 1 week. Please call for any medication changes or upcoming procedures. Coumadin  Clinic [phone]

## 2024-04-09 ENCOUNTER — Telehealth (HOSPITAL_COMMUNITY): Payer: Self-pay

## 2024-04-09 NOTE — Telephone Encounter (Signed)
 Patient called to schedule cardiac rehab, informed him he needs to attend his f/u on 2/02 before we can proceed with scheduling.

## 2024-04-14 ENCOUNTER — Telehealth: Payer: Self-pay | Admitting: Cardiology

## 2024-04-14 NOTE — Telephone Encounter (Signed)
" °  Per MyChart scheduling message:  I had a lipid panel and my cholesterol and triglyceride are elevated. So what medicine do I need to take  "

## 2024-04-15 ENCOUNTER — Ambulatory Visit: Attending: Cardiology | Admitting: *Deleted

## 2024-04-15 DIAGNOSIS — Z954 Presence of other heart-valve replacement: Secondary | ICD-10-CM | POA: Diagnosis not present

## 2024-04-15 DIAGNOSIS — Z5181 Encounter for therapeutic drug level monitoring: Secondary | ICD-10-CM

## 2024-04-15 LAB — POCT INR: POC INR: 3.1

## 2024-04-15 NOTE — Progress Notes (Signed)
 Lab Results  Component Value Date   INR 3.1 04/15/2024   INR 3.6 04/08/2024   INR 2.7 04/01/2024    Description   Inr 3.1, START taking warfarin 1 tablet daily except for 1/2 a tablet on Wednesday and Saturday.  Recheck INR in 1 week. Please call for any medication changes or upcoming procedures. [facility] [phone]

## 2024-04-15 NOTE — Patient Instructions (Signed)
 Description   Inr 3.1, START taking warfarin 1 tablet daily except for 1/2 a tablet on Wednesday and Saturday.  Recheck INR in 1 week. Please call for any medication changes or upcoming procedures. [facility] [phone]

## 2024-04-20 ENCOUNTER — Other Ambulatory Visit: Payer: Self-pay

## 2024-04-20 DIAGNOSIS — Z954 Presence of other heart-valve replacement: Secondary | ICD-10-CM

## 2024-04-22 ENCOUNTER — Ambulatory Visit
Admission: RE | Admit: 2024-04-22 | Discharge: 2024-04-22 | Disposition: A | Discharge status: Home / Self Care | Source: Ambulatory Visit | Attending: Cardiology | Admitting: Cardiology

## 2024-04-22 ENCOUNTER — Ambulatory Visit: Payer: Self-pay | Admitting: Cardiology

## 2024-04-22 ENCOUNTER — Ambulatory Visit

## 2024-04-22 DIAGNOSIS — Z5181 Encounter for therapeutic drug level monitoring: Secondary | ICD-10-CM | POA: Insufficient documentation

## 2024-04-22 DIAGNOSIS — Z954 Presence of other heart-valve replacement: Secondary | ICD-10-CM | POA: Diagnosis present

## 2024-04-22 DIAGNOSIS — I5030 Unspecified diastolic (congestive) heart failure: Secondary | ICD-10-CM

## 2024-04-22 DIAGNOSIS — Z952 Presence of prosthetic heart valve: Secondary | ICD-10-CM | POA: Diagnosis present

## 2024-04-22 LAB — POCT INR: INR: 2.8 (ref 2.0–3.0)

## 2024-04-22 LAB — ECHOCARDIOGRAM COMPLETE
AR max vel: 1.68 cm2
AV Area VTI: 1.78 cm2
AV Area mean vel: 1.82 cm2
AV Mean grad: 10 mmHg
AV Peak grad: 17.5 mmHg
Ao pk vel: 2.09 m/s
Area-P 1/2: 3.91 cm2
S' Lateral: 4.3 cm

## 2024-04-22 NOTE — Progress Notes (Signed)
 INR 2.8  Continue taking warfarin 1 tablet daily except for 1/2 a tablet on Wednesday and Saturday.  Recheck INR in 2 week. Please call for any medication changes or upcoming procedures. [facility] [phone]

## 2024-04-22 NOTE — Patient Instructions (Signed)
 Continue taking warfarin 1 tablet daily except for 1/2 a tablet on Wednesday and Saturday.  Recheck INR in 2 week. Please call for any medication changes or upcoming procedures. [facility] [phone]

## 2024-04-26 ENCOUNTER — Ambulatory Visit

## 2024-04-27 ENCOUNTER — Ambulatory Visit (HOSPITAL_COMMUNITY)

## 2024-04-30 ENCOUNTER — Other Ambulatory Visit: Payer: Self-pay

## 2024-04-30 ENCOUNTER — Other Ambulatory Visit: Payer: Self-pay | Admitting: Cardiology

## 2024-04-30 DIAGNOSIS — Z954 Presence of other heart-valve replacement: Secondary | ICD-10-CM

## 2024-05-03 ENCOUNTER — Ambulatory Visit: Payer: Self-pay

## 2024-05-24 ENCOUNTER — Ambulatory Visit: Payer: Self-pay | Admitting: Cardiology

## 2024-06-07 ENCOUNTER — Ambulatory Visit: Payer: Self-pay | Admitting: Cardiology
